# Patient Record
Sex: Female | Born: 1980 | Race: Black or African American | Hispanic: No | Marital: Single | Smoking: Current every day smoker
Health system: Southern US, Community
[De-identification: ages and names within clinical notes are randomized; demographics above are authoritative.]

## PROBLEM LIST (undated history)

## (undated) DIAGNOSIS — Z5189 Encounter for other specified aftercare: Secondary | ICD-10-CM

## (undated) DIAGNOSIS — D509 Iron deficiency anemia, unspecified: Secondary | ICD-10-CM

## (undated) DIAGNOSIS — A64 Unspecified sexually transmitted disease: Secondary | ICD-10-CM

## (undated) DIAGNOSIS — R87619 Unspecified abnormal cytological findings in specimens from cervix uteri: Secondary | ICD-10-CM

## (undated) DIAGNOSIS — K5090 Crohn's disease, unspecified, without complications: Secondary | ICD-10-CM

## (undated) DIAGNOSIS — K859 Acute pancreatitis without necrosis or infection, unspecified: Secondary | ICD-10-CM

## (undated) HISTORY — DX: Unspecified abnormal cytological findings in specimens from cervix uteri: R87.619

## (undated) HISTORY — DX: Encounter for other specified aftercare: Z51.89

## (undated) HISTORY — DX: Acute pancreatitis without necrosis or infection, unspecified: K85.90

## (undated) HISTORY — DX: Unspecified sexually transmitted disease: A64

## (undated) HISTORY — DX: Iron deficiency anemia, unspecified: D50.9

## (undated) HISTORY — PX: UMBILICAL HERNIA REPAIR: SHX196

## (undated) HISTORY — DX: Crohn's disease, unspecified, without complications: K50.90

## (undated) HISTORY — PX: OTHER SURGICAL HISTORY: SHX169

---

## 2003-01-07 ENCOUNTER — Emergency Department (HOSPITAL_COMMUNITY): Admission: EM | Admit: 2003-01-07 | Discharge: 2003-01-07 | Payer: Self-pay

## 2003-01-31 ENCOUNTER — Other Ambulatory Visit: Admission: RE | Admit: 2003-01-31 | Discharge: 2003-01-31 | Payer: Self-pay | Admitting: Obstetrics and Gynecology

## 2003-03-15 DIAGNOSIS — K5090 Crohn's disease, unspecified, without complications: Secondary | ICD-10-CM | POA: Insufficient documentation

## 2003-05-17 ENCOUNTER — Emergency Department (HOSPITAL_COMMUNITY): Admission: EM | Admit: 2003-05-17 | Discharge: 2003-05-17 | Payer: Self-pay | Admitting: Emergency Medicine

## 2003-05-17 ENCOUNTER — Encounter (INDEPENDENT_AMBULATORY_CARE_PROVIDER_SITE_OTHER): Payer: Self-pay | Admitting: *Deleted

## 2003-07-27 ENCOUNTER — Emergency Department (HOSPITAL_COMMUNITY): Admission: EM | Admit: 2003-07-27 | Discharge: 2003-07-27 | Payer: Self-pay

## 2003-07-27 ENCOUNTER — Encounter (INDEPENDENT_AMBULATORY_CARE_PROVIDER_SITE_OTHER): Payer: Self-pay | Admitting: *Deleted

## 2003-07-30 ENCOUNTER — Inpatient Hospital Stay (HOSPITAL_COMMUNITY): Admission: EM | Admit: 2003-07-30 | Discharge: 2003-08-03 | Payer: Self-pay | Admitting: Emergency Medicine

## 2003-07-30 ENCOUNTER — Encounter: Payer: Self-pay | Admitting: *Deleted

## 2003-07-30 ENCOUNTER — Encounter (INDEPENDENT_AMBULATORY_CARE_PROVIDER_SITE_OTHER): Payer: Self-pay | Admitting: *Deleted

## 2003-07-31 ENCOUNTER — Encounter (INDEPENDENT_AMBULATORY_CARE_PROVIDER_SITE_OTHER): Payer: Self-pay | Admitting: *Deleted

## 2003-08-01 ENCOUNTER — Encounter (INDEPENDENT_AMBULATORY_CARE_PROVIDER_SITE_OTHER): Payer: Self-pay | Admitting: *Deleted

## 2003-08-03 ENCOUNTER — Encounter (INDEPENDENT_AMBULATORY_CARE_PROVIDER_SITE_OTHER): Payer: Self-pay | Admitting: *Deleted

## 2003-08-04 ENCOUNTER — Encounter (INDEPENDENT_AMBULATORY_CARE_PROVIDER_SITE_OTHER): Payer: Self-pay | Admitting: *Deleted

## 2003-08-04 ENCOUNTER — Ambulatory Visit (HOSPITAL_COMMUNITY): Admission: RE | Admit: 2003-08-04 | Discharge: 2003-08-04 | Payer: Self-pay | Admitting: Gastroenterology

## 2003-08-27 ENCOUNTER — Encounter: Payer: Self-pay | Admitting: Internal Medicine

## 2006-03-14 DIAGNOSIS — Z87442 Personal history of urinary calculi: Secondary | ICD-10-CM

## 2007-03-15 DIAGNOSIS — R87619 Unspecified abnormal cytological findings in specimens from cervix uteri: Secondary | ICD-10-CM

## 2007-03-15 DIAGNOSIS — Z5189 Encounter for other specified aftercare: Secondary | ICD-10-CM

## 2007-03-15 HISTORY — DX: Encounter for other specified aftercare: Z51.89

## 2007-03-15 HISTORY — DX: Unspecified abnormal cytological findings in specimens from cervix uteri: R87.619

## 2008-03-14 HISTORY — PX: SMALL INTESTINE SURGERY: SHX150

## 2008-08-12 ENCOUNTER — Encounter: Payer: Self-pay | Admitting: Internal Medicine

## 2008-08-21 ENCOUNTER — Encounter: Payer: Self-pay | Admitting: Internal Medicine

## 2008-08-27 ENCOUNTER — Encounter: Payer: Self-pay | Admitting: Internal Medicine

## 2008-09-19 ENCOUNTER — Encounter: Payer: Self-pay | Admitting: Internal Medicine

## 2009-08-05 ENCOUNTER — Encounter (INDEPENDENT_AMBULATORY_CARE_PROVIDER_SITE_OTHER): Payer: Self-pay | Admitting: *Deleted

## 2009-08-05 ENCOUNTER — Ambulatory Visit: Payer: Self-pay | Admitting: Internal Medicine

## 2009-08-05 ENCOUNTER — Inpatient Hospital Stay (HOSPITAL_COMMUNITY): Admission: EM | Admit: 2009-08-05 | Discharge: 2009-08-07 | Payer: Self-pay | Admitting: Emergency Medicine

## 2009-08-05 ENCOUNTER — Encounter (INDEPENDENT_AMBULATORY_CARE_PROVIDER_SITE_OTHER): Payer: Self-pay | Admitting: Internal Medicine

## 2009-08-06 ENCOUNTER — Encounter: Payer: Self-pay | Admitting: Internal Medicine

## 2009-08-06 ENCOUNTER — Encounter (INDEPENDENT_AMBULATORY_CARE_PROVIDER_SITE_OTHER): Payer: Self-pay | Admitting: Internal Medicine

## 2009-08-07 ENCOUNTER — Encounter (INDEPENDENT_AMBULATORY_CARE_PROVIDER_SITE_OTHER): Payer: Self-pay | Admitting: *Deleted

## 2009-08-08 ENCOUNTER — Encounter: Payer: Self-pay | Admitting: Internal Medicine

## 2009-08-13 ENCOUNTER — Encounter: Payer: Self-pay | Admitting: Internal Medicine

## 2009-08-14 DIAGNOSIS — D638 Anemia in other chronic diseases classified elsewhere: Secondary | ICD-10-CM

## 2009-08-14 DIAGNOSIS — K859 Acute pancreatitis without necrosis or infection, unspecified: Secondary | ICD-10-CM

## 2009-08-14 DIAGNOSIS — D509 Iron deficiency anemia, unspecified: Secondary | ICD-10-CM

## 2009-08-20 ENCOUNTER — Ambulatory Visit: Payer: Self-pay | Admitting: Internal Medicine

## 2009-08-20 LAB — CONVERTED CEMR LAB
ALT: 16 units/L (ref 0–35)
AST: 12 units/L (ref 0–37)
Basophils Absolute: 0.1 10*3/uL (ref 0.0–0.1)
CO2: 27 meq/L (ref 19–32)
Eosinophils Absolute: 0.1 10*3/uL (ref 0.0–0.7)
GFR calc non Af Amer: 160.16 mL/min (ref >60)
HCT: 38.5 % (ref 36.0–46.0)
Lymphocytes Relative: 15.6 % (ref 12.0–46.0)
Lymphs Abs: 2.2 10*3/uL (ref 0.7–4.0)
MCHC: 32.7 g/dL (ref 30.0–36.0)
Monocytes Relative: 1.7 % — ABNORMAL LOW (ref 3.0–12.0)
Platelets: 523 10*3/uL — ABNORMAL HIGH (ref 150.0–400.0)
RDW: 24.7 % — ABNORMAL HIGH (ref 11.5–14.6)
Sodium: 141 meq/L (ref 135–145)
Total Bilirubin: 0.9 mg/dL (ref 0.3–1.2)
Total Protein: 6.7 g/dL (ref 6.0–8.3)

## 2009-08-25 ENCOUNTER — Ambulatory Visit: Payer: Self-pay | Admitting: Nurse Practitioner

## 2009-10-05 ENCOUNTER — Emergency Department (HOSPITAL_COMMUNITY): Admission: EM | Admit: 2009-10-05 | Discharge: 2009-10-05 | Payer: Self-pay | Admitting: Family Medicine

## 2009-12-28 ENCOUNTER — Ambulatory Visit: Payer: Self-pay | Admitting: Internal Medicine

## 2009-12-30 LAB — CONVERTED CEMR LAB
ALT: 17 units/L (ref 0–35)
AST: 20 units/L (ref 0–37)
Alkaline Phosphatase: 85 units/L (ref 39–117)
BUN: 16 mg/dL (ref 6–23)
Basophils Absolute: 0.1 10*3/uL (ref 0.0–0.1)
Basophils Relative: 0.8 % (ref 0.0–3.0)
Calcium: 8.9 mg/dL (ref 8.4–10.5)
Creatinine, Ser: 0.6 mg/dL (ref 0.4–1.2)
Eosinophils Absolute: 0.3 10*3/uL (ref 0.0–0.7)
HCT: 35 % — ABNORMAL LOW (ref 36.0–46.0)
Hemoglobin: 11.9 g/dL — ABNORMAL LOW (ref 12.0–15.0)
Lymphocytes Relative: 19.5 % (ref 12.0–46.0)
Lymphs Abs: 1.4 10*3/uL (ref 0.7–4.0)
MCHC: 34 g/dL (ref 30.0–36.0)
MCV: 86.5 fL (ref 78.0–100.0)
Monocytes Absolute: 0.3 10*3/uL (ref 0.1–1.0)
Neutro Abs: 5.1 10*3/uL (ref 1.4–7.7)
RBC: 4.05 M/uL (ref 3.87–5.11)
RDW: 18.1 % — ABNORMAL HIGH (ref 11.5–14.6)
Saturation Ratios: 4.8 % — ABNORMAL LOW (ref 20.0–50.0)
Sed Rate: 18 mm/hr (ref 0–22)
Total Bilirubin: 0.3 mg/dL (ref 0.3–1.2)
Transferrin: 250.9 mg/dL (ref 212.0–360.0)

## 2010-03-18 ENCOUNTER — Ambulatory Visit: Admit: 2010-03-18 | Payer: Self-pay | Admitting: Internal Medicine

## 2010-04-13 NOTE — Procedures (Signed)
Summary: Colonoscopy/[facility]  Colonoscopy/[facility]   Imported By: Sherian Rein 08/27/2009 08:26:56  _____________________________________________________________________  External Attachment:    Type:   Image     Comment:   External Document

## 2010-04-13 NOTE — Procedures (Signed)
Summary: Colonoscopy  Patient: Heather Foley Note: All result statuses are Final unless otherwise noted.  Tests: (1) Colonoscopy (COL)   COL Colonoscopy           DONE      [facility]     [address]     [place], [place]  [address]           COLONOSCOPY PROCEDURE REPORT           PATIENT:  Heather, Foley  MR#:  [id number]     BIRTHDATE:  [birth year], 29 yrs. old  GENDER:  female     ENDOSCOPIST:  Hedwig Morton. Juanda Chance, MD     REF. BY:     PROCEDURE DATE:  08/06/2009     PROCEDURE:  Colonoscopy 19147     ASA CLASS:  Class I     INDICATIONS:  abnl CT scan, Crohn's disease ileocolic anastomosis,           MEDICATIONS:   Versed 10 mg, Fentanyl 150 mcg           DESCRIPTION OF PROCEDURE:   After the risks benefits and     alternatives of the procedure were thoroughly explained, informed     consent was obtained.  Digital rectal exam was performed and     revealed no rectal masses.   The EC-3890Li ([id number]) endoscope was     introduced through the anus and advanced to the anastamosis,     without limitations.  The quality of the prep was good, using     MiraLax.  The instrument was then slowly withdrawn as the colon     was fully examined.     <<PROCEDUREIMAGES>>           FINDINGS:  There were mucosal changes consistent with Crohn's     disease. anastamosis ulcerated mucosa at the anastomosis and     surrounding the anastomosis With standard forceps, biopsy was     obtained and sent to pathology (see image001, image005, and     image006).  A stricture was found anastamosis 4-5 mm stricture,     unable to pass the scope  This was otherwise a normal examination     of the colon. no evidence oc active colitis in the rest of the     colon Random biopsies were obtained and sent to pathology.     Retroflexed views in the rectum revealed no abnormalities.    The     scope was then withdrawn from the patient and the procedure     completed.           COMPLICATIONS:   None     ENDOSCOPIC IMPRESSION:     1) Colitis - Crohn's in the anastamosis     2) Stricture in the anastamosis     3) Otherwise normal examination     RECOMMENDATIONS:     see chart for plan of treatment, will Start Imuran and continue     Prenisone, she may need b iologicals in the future     REPEAT EXAM:  In 5 year(s) for.           ______________________________     Hedwig Morton. Juanda Chance, MD           CC:           n.     eSIGNED:   Hedwig Morton. Brodie at 08/06/2009 02:13 PM  Heather, Foley, [id number]  Note: An exclamation mark (!) indicates a result that was not dispersed into the flowsheet. Document Creation Date: 08/06/2009 2:14 PM _______________________________________________________________________  (1) Order result status: Final Collection or observation date-time: 08/06/2009 14:08 Requested date-time:  Receipt date-time:  Reported date-time:  Referring Physician:   Ordering Physician: [name] [phone]) Specimen Source:  Source: Launa Grill Order Number: [id number] Lab site:   Appended Document: Colonoscopy Recall is in IDX for  07/2014.

## 2010-04-13 NOTE — Letter (Signed)
Summary: [facility]  [facility]   Imported By: Sherian Rein 08/27/2009 08:21:38  _____________________________________________________________________  External Attachment:    Type:   Image     Comment:   External Document

## 2010-04-13 NOTE — Assessment & Plan Note (Signed)
Summary: New - Establish Care   Vital Signs:  Patient profile:   30 year old female LMP:     08/2009 Height:      68 inches Weight:      186 pounds BMI:     28.38 BSA:     1.98 Temp:     97.5 degrees F oral Pulse rate:   75 / minute Pulse rhythm:   regular Resp:     20 per minute BP sitting:   109 / 75  (left arm) Cuff size:   regular  Vitals Entered By: Levon Hedger (August 25, 2009 10:18 AM) CC: follow-up visit [facility] hospital Is Patient Diabetic? No Pain Assessment Patient in pain? yes     Location: abdomen  Does patient need assistance? Functional Status Self care Ambulation Normal LMP (date): 08/2009     Enter LMP: 08/2009   CC:  follow-up visit [facility] hospital.  History of Present Illness:  Pt into the office to establish care. no previous PCP relocated to [facility] in January 2011 Hospitalized on 08/04/2009 (full discharge note reviewed) Presented with abdominal pain, nausea and diarrhea. S/p previous partial small bowel resection as well as right hemicoloectomy in July 2011  1.  Crohn's Flare - CT showed inflammation surrounding the second segment of the duodenum and pancreatic head reflecting exacerbation of crohn disease. Started on IV steroids and antibiotics stool negative for c.diff Imuran started by Dr. Juanda Chance for immunosuppressive, immunomudulatory effcts for active crohn disease with stricture Enteroscpy - there is no crohn disease of the stomach, duodenum and jejum and mucosa up to the ligament of Treitz was all normal Colonscopy - crohn colitis in the anastomosis ileocolic site as well as stricture in the anastomosis Pt has beent o see Dr. Juanda Chance on June 9th as ordered Pt is on prednisone taper as per GI and imuran  Last CPE 08/2007 - pt will need to schedule PAP  no acute concerns today  Habits & Providers  Alcohol-Tobacco-Diet     Alcohol drinks/day: 0     Tobacco Status: never  Exercise-Depression-Behavior     Drug Use: never  Allergies  (verified): 1)  ! Pcn  Social History: Occupation: social services ---currently unemployed Patient has never smoked.  Alcohol Use - yes-occasional Illicit Drug Use - no Single 1 child - natural birthDrug Use:  never  Review of Systems General:  Denies fever. CV:  Denies chest pain or discomfort. Resp:  Denies cough. GI:  Denies abdominal pain and diarrhea.  Physical Exam  General:  alert.   Head:  normocephalic.   Lungs:  normal breath sounds.   Heart:  normal rate and regular rhythm.   Abdomen:  soft, non-tender, and normal bowel sounds.   Msk:  normal ROM.   Neurologic:  alert & oriented X3.   Psych:  Oriented X3.     Impression & Recommendations:  Problem # 1:  CROHN'S DISEASE (ICD-555.9) stable  f/u with GI as per directed  Problem # 2:  ANEMIA, IRON DEFICIENCY (ICD-280.9) labs done at GI - stable  Complete Medication List: 1)  Azathioprine 50 Mg Tabs (Azathioprine) .... Take 1.5 tablets by mouth once daily 2)  Prednisone 20 Mg Tabs (Prednisone) .... 2 tablets by mouth once daily 3)  Prednisone 5 Mg Tabs (Prednisone) .... Take as directed  Patient Instructions: 1)  Schedule an appointment for a Complete physical exam. 2)  Schedule this after your eligibility appointment.  3)  Come fasting - do not eat after midnight  before this appointment   Colonoscopy  Procedure date:  08/06/2009  Findings:      crohn colitis in the anastomosis ileocolic site as well as stricture in the anastomosis  EGD  Procedure date:  08/06/2009  Findings:      no crohn disease of the stomach, duodenum, and jejunum, and the  mucosa up to the ligament of Treitz was all normal   Colonoscopy  Procedure date:  08/06/2009  Findings:      crohn colitis in the anastomosis ileocolic site as well as stricture in the anastomosis  EGD  Procedure date:  08/06/2009  Findings:      no crohn disease of the stomach, duodenum, and jejunum, and the  mucosa up to the ligament of  Treitz was all normal

## 2010-04-13 NOTE — Letter (Signed)
Summary: [facility]  [facility]   Imported By: Sherian Rein 08/27/2009 08:24:25  _____________________________________________________________________  External Attachment:    Type:   Image     Comment:   External Document

## 2010-04-13 NOTE — Letter (Signed)
Summary: Patient Notice- Colon Biospy Results  [facility]  [address] [place], [place] [address]   Phone: [phone]  Fax: [phone]        Aug 08, 2009 MRN: [id number]    Heather Foley [address] [place], [place]  [address]    Dear [name],  I am pleased to inform you that the biopsies taken during your recent colonoscopy did not show any evidence of cancer upon pathologic examination.The biopsies show Crohn's disease at the anastomosis but no Crohn's disease in the rest of the colon  Additional information/recommendations:  __No further action is needed at this time.  Please follow-up with      your primary care physician for your other healthcare needs.  _x_Please call [phone] to schedule a return visit to review      your condition.You already have an appointment scheduled.  _x_Continue with the treatment plan as outlined on the day of your      exam.  _x_You should have a repeat colonoscopy examination for this problem           in _5 years.  Please call us if you are having persistent problems or have questions about your condition that have not been fully answered at this time.  Sincerely,  Hart Carwin MD   This letter has been electronically signed by your physician.  Appended Document: Patient Notice- Colon Biospy Results Letter mailed to patient.

## 2010-04-13 NOTE — Letter (Signed)
Summary: PT INFORMATION SHEET  PT INFORMATION SHEET   Imported By: Arta Bruce 09/22/2009 15:30:23  _____________________________________________________________________  External Attachment:    Type:   Image     Comment:   External Document

## 2010-04-13 NOTE — Consult Note (Signed)
Summary: ABDOMINAL PAIN  NAME:  [name], [name] NO.:  [id number]   MEDICAL RECORD NO.:  [id number]                   PATIENT TYPE:  INP   LOCATION:  0478                                 FACILITY:  [facility]   PHYSICIAN:  Anselm Pancoast. Zachery Dakins, M.D.          DATE OF BIRTH:  Aug 11, 1980   DATE OF CONSULTATION:  07/30/2003  DATE OF DISCHARGE:                                   CONSULTATION   REFERRED BY:  Dr. Freida Busman   HISTORY:  Dr. Freida Busman, ER physician at [facility], asked me to see Ms.  Heather Foley, a 30 year old black female, who experienced abdominal pain  for the last two to three months. She had a CT this morning that showed a  near obstruction of the small bowel and possibly Crohn's disease. For this  reason and my being the surgeon on call, I was asked to see the patient.   It appears that she had had a previous CT back in March and at that time it  was felt to be pelvic inflammatory disease, but on retrospect, in view of  that CT it looks there was a loop of terminal ileum that is probably Crohn's  disease that was not recognized.  On the CT now revealed __________ , this  appears that there has definitely been a progression in Crohn's disease.   The patient is bloated, but not acutely tender, and I questioned whether  this could be possibly be treated medically and avoid the need for small  bowel resection at this time.   I called and had Dr. Evette Cristal see her and after his review and discussing it  with him, we were of the opinion that a trial of medical management would be  preferable to exploratory surgery for high-grade partial obstruction and  small bowel resection at this time. This patient has been on antibiotics in  treatment of a presumably pelvic inflammatory disease, but the findings on  CT are certainly more consistent with probable Crohn's than __________ PID.   The patient will be admitted to Dr. Luan Moore service and I will follow along  in case surgery would be needed if she does not respond to medical  management.                                               Anselm Pancoast. Zachery Dakins, M.D.    WJW/MEDQ  D:  07/30/2003  T:  07/30/2003  Job:  [id number]

## 2010-04-13 NOTE — Letter (Signed)
Summary: [facility]  [facility]   Imported By: Sherian Rein 08/27/2009 08:23:07  _____________________________________________________________________  External Attachment:    Type:   Image     Comment:   External Document

## 2010-04-13 NOTE — Letter (Signed)
Summary: [facility]  [facility]   Imported By: Lester Adair 09/09/2009 11:02:00  _____________________________________________________________________  External Attachment:    Type:   Image     Comment:   External Document

## 2010-04-13 NOTE — Procedures (Signed)
Summary: Upper Endoscopy  Patient: Heather Foley Note: All result statuses are Final unless otherwise noted.  Tests: (1) Upper Endoscopy (EGD)   EGD Upper Endoscopy       DONE     [facility]     [address]     [place], [place]  [address]           ENDOSCOPY PROCEDURE REPORT           PATIENT:  Heather, Foley  MR#:  [id number]     BIRTHDATE:  1981-02-16, 29 yrs. old  GENDER:  female           ENDOSCOPIST:  Hedwig Morton. Juanda Chance, MD     Referred by:           PROCEDURE DATE:  08/05/2009     PROCEDURE:  enteroscopy [id number]     ASA CLASS:  Class I     INDICATIONS:  abd. pain, abnormal CT scan, Crohn's disease, s/p TI     resection 2010; ? recurrent Crohn's in the duodenum           MEDICATIONS:   Versed 10 mg, Fentanyl 100 mcg     TOPICAL ANESTHETIC:  Cetacaine Spray           DESCRIPTION OF PROCEDURE:   After the risks benefits and     alternatives of the procedure were thoroughly explained, informed     consent was obtained.  The EC-[id number] ([id number]) endoscope was     introduced through the mouth and advanced to the proximal jejunum,     without limitations.  The instrument was slowly withdrawn as the     mucosa was fully examined.     <<PROCEDUREIMAGES>>           Mild gastritis was found. minimal antral gastritis, With standard     forceps, a biopsy was obtained and sent to pathology.  Otherwise     the examination was normal in the proximal jejunum. normal     appearing duodenum and proximal jejunum to 120cm, Random biopsies     were obtained and sent to pathology (see image002, image003,     image006, image005, image004, and image001).    Retroflexed views     revealed no abnormalities.    The scope was then withdrawn from     the patient and the procedure completed.           COMPLICATIONS:  None           ENDOSCOPIC IMPRESSION:     1) Mild gastritis     2) Otherwise normal examination in the proximal jejunum     no evidence of Crohn's disease  s/p random biopsies     RECOMMENDATIONS:     see chart for plan of treatment, colonoscopy tomorrow           REPEAT EXAM:  In 0 year(s) for.           ______________________________     Hedwig Morton. Juanda Chance, MD           CC:           n.     eSIGNED:   Hedwig Morton. Antionetta Ator at 08/05/2009 03:34 PM           Morrie Sheldon, [id number]  Note: An exclamation mark (!) indicates a result that was not dispersed into the flowsheet. Document Creation Date: 08/05/2009 3:36 PM _______________________________________________________________________  (1) Order result status: Final Collection  or observation date-time: 08/05/2009 15:29 Requested date-time:  Receipt date-time:  Reported date-time:  Referring Physician:   Ordering Physician: [name] [phone]) Specimen Source:  Source: Launa Grill Order Number: [id number] Lab site:

## 2010-04-13 NOTE — Discharge Summary (Signed)
Summary: ABDOMINAL PAIN  NAME:  Heather Foley, Heather Foley NO.:  [id number]   MEDICAL RECORD NO.:  [id number]                   PATIENT TYPE:  INP   LOCATION:  0478                                 FACILITY:  [facility]   PHYSICIAN:  Petra Kuba, M.D.                 DATE OF BIRTH:  02-22-1981   DATE OF ADMISSION:  07/30/2003  DATE OF DISCHARGE:  08/03/2003                                 DISCHARGE SUMMARY   HISTORY:  Patient was admitted with abdominal pain that had been going on  off and on for a few months.  A CAT scan showed a partial small bowel  obstruction.  Dr. Thornton Dales was consulted and felt this was compatible  with Crohn's disease, and Dr. Evette Cristal admitted the patient.  She seemed to  respond well to IV Flagyl and Solu-Medrol.  By the 20th was feeling much  better.  They changed her Solu-Medrol to Entocort.  They changed her Flagyl  to p.o. and slowly advanced her diet.  Also she was still having some  abdominal pain, she was able to advance her diet.  We changed her to p.o.  meds and by the 22nd was deemed ready for discharge.  No other workup is  done during her hospitalization.   DISCHARGE DIAGNOSIS:  Abnormal computerized tomographic scan, probably due  to Crohn's disease.   CONDITION ON DISCHARGE:  Improved.   DISCHARGE INSTRUCTIONS:  1. Flagyl 250 b.i.d. for 7 days.  2. Entocort 3 mg 3 pills a day.  3. Tylenol or Ultracet for pain.  4. She also has Vicodin at home that she could take.  5. No aspirin, Advil, Aleve, ibuprofen, or Motrin.  6. Pain management as discussed above.   ACTIVITY:  Okay to work Thursday, May 26th.   DIET:  No nuts, seeds, popcorn, fried fish or uncooked veggies until better.   WOUND CARE:  Not applicable.   SPECIAL INSTRUCTIONS:  Follow up increased pain, nausea, vomiting, fever,  diarrhea, or signs of significant bleeding.   FOLLOWUP:  Will set up an outpatient small bowel series for this week and  then follow  up with Dr. Evette Cristal in a few days after that to go over the  results and recheck symptoms.  I did discuss with her how she would probably  need an outpatient colonoscopy at some point.  Call us soon or p.r.n., as  above.   CONDITION ON DISCHARGE:  Improved.                                               Petra Kuba, M.D.    MEM/MEDQ  D:  08/03/2003  T:  08/04/2003  Job:  [id number]   cc:   Anselm Pancoast. Zachery Dakins, M.D.  [address], [address]  [place]  [place] [address]  Fax: [phone]

## 2010-04-13 NOTE — Assessment & Plan Note (Signed)
Summary: POST HOSPITAL FOLLOW-UP FOR CROHN'S          [facility]   History of Present Illness Visit Type: Follow-up Visit Primary GI MD: [name] MD Chief Complaint: follow-up hospitalization crohn's disease  Pt. denies any GI complaints  at this time. History of Present Illness:   This is a 30 year old African American female with Crohn's disease of the terminal ileum. She is status post hospitalization at [facility] with abdominal pain and an inflammatory process alongside of the duodenum originating in the area of the terminal ileum. She was discharged 3 weeks ago on Imuran 75 mg daily but she has taken only 50 mg a day. She has also been on a prednisone taper starting at 40 mg a day. She feels 100% improved. There is no rectal bleeding, abdominal pain or diarrhea. Her weight has remained stable. There has been no fever. She is status post terminal ileal resection in [place] one year ago.   GI Review of Systems    Reports acid reflux and  heartburn.      Denies abdominal pain, belching, bloating, chest pain, dysphagia with liquids, dysphagia with solids, loss of appetite, nausea, vomiting, vomiting blood, weight loss, and  weight gain.        Denies anal fissure, black tarry stools, change in bowel habit, constipation, diarrhea, diverticulosis, fecal incontinence, heme positive stool, hemorrhoids, irritable bowel syndrome, jaundice, light color stool, liver problems, rectal bleeding, and  rectal pain. Preventive Screening-Counseling & Management      Drug Use:  no.      Current Medications (verified): 1)  Azathioprine 50 Mg Tabs (Azathioprine) .Marland Kitchen.. 1 1/2 Tablets By Mouth Once Daily 2)  Prednisone 20 Mg Tabs (Prednisone) .... 2 Tablets By Mouth Once Daily  Allergies (verified): 1)  ! Pcn  Past History:  Past Medical History: Reviewed history from 08/14/2009 and no changes required. Current Problems:  ANEMIA, IRON DEFICIENCY (ICD-280.9) ANEMIA OF CHRONIC DISEASE  (ICD-285.29) PANCREATITIS, ACUTE (ICD-577.0) CROHN'S DISEASE (ICD-555.9)    Past Surgical History: Reviewed history from 08/14/2009 and no changes required. Umbilical Hernia Repair Small Bowel Resection and Right Hemicolectomy -in [place]  Family History: Reviewed history from 08/14/2009 and no changes required. No FH of Colon Cancer:  Family History of Crohn's: Paternal Cousin, Aunts Family History of Diabetes: Father  Social History: Reviewed history from 08/14/2009 and no changes required. 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  Alcohol Use - yes-occasional Illicit Drug Use - no SingleDrug Use:  no  Review of Systems       The patient complains of headaches-new.  The patient denies allergy/sinus, anemia, anxiety-new, arthritis/joint pain, back pain, blood in urine, breast changes/lumps, change in vision, confusion, cough, coughing up blood, depression-new, fainting, fatigue, fever, hearing problems, heart murmur, heart rhythm changes, itching, muscle pains/cramps, night sweats, nosebleeds, shortness of breath, skin rash, sleeping problems, sore throat, swelling of feet/legs, swollen lymph glands, thirst - excessive, urination - excessive, urination changes/pain, urine leakage, vision changes, and voice change.         Pertinent positive and negative review of systems were noted in the above HPI. All other ROS was otherwise negative.   Vital Signs:  Patient profile:   30 year old female Height:      69 inches Weight:      189 pounds BMI:     28.01 Pulse rate:   120 / minute Pulse rhythm:   regular BP sitting:   104 / 58  (left  arm)  Vitals Entered By: Milford Cage NCMA (August 20, 2009 2:47 PM)  Physical Exam  General:  alert, oriented and in no distress. Slightly overweight. Eyes:  nonicteric. Mouth:  no aphthous ulcers. Neck:  Supple; no masses or thyromegaly. Lungs:  Clear throughout to auscultation. Heart:  Regular rate and  rhythm; no murmurs, rubs,  or bruits. Abdomen:  soft relaxed abdomen with mild tenderness in epigastrium and in the right upper quadrant extending to right middle quadrant, bowel sounds are normoactive. There is no distention and no palpable mass. Extremities:  No clubbing, cyanosis, edema or deformities noted. Skin:  Intact without significant lesions or rashes. Psych:  Alert and cooperative. Normal mood and affect.   Impression & Recommendations:  Problem # 1:  CROHN'S DISEASE (ICD-555.9) Patient has Crohn's disease of the terminal ileum and is status post terminal ileal resection one year ago. She has active inflammation in the periduodenal area. An upper endoscopy did not show any evidence of active Crohn's disease of the duodenum. A colonoscopy confirmed involvement at the ileocolic anastomosis. She is doing very well and will continue the prednisone taper. She will increase her Imuran to 75 mg daily and decrease her prednisone by 5 mg every 2 weeks. She will see me in 8 weeks. We will check on her CBC and metabolic panel as well as a sedimentation rate today. I advised her to take iron supplements over the counter. Orders: TLB-CBC Platelet - w/Differential (85025-CBCD) TLB-CMP (Comprehensive Metabolic Pnl) (80053-COMP) TLB-Sedimentation Rate (ESR) (85652-ESR)  Problem # 2:  ANEMIA, IRON DEFICIENCY (ICD-280.9) We will check a CBC today. Start one a day iron supplements. Orders: TLB-CBC Platelet - w/Differential (85025-CBCD) TLB-CMP (Comprehensive Metabolic Pnl) (80053-COMP) TLB-Sedimentation Rate (ESR) (85652-ESR)  Patient Instructions: 1)  Begin iron supplements daily 2)  Please pick up iron supplements over the counter. You will need 325 mg tablets. 3)  Please take Imuran 75 mg (1.5 tablets) daily. A new prescription will be sent to your pharmacy. 4)  Please take Prednisone as directed. We have sent a new prescription for 5 mg tablets. 5)  Please have labs drawn today before  leaving.This will include a CBC, CMET, Sed rate. 6)  Office visit will be needed in 8 weeks. 7)  The medication list was reviewed and reconciled.  All changed / newly prescribed medications were explained.  A complete medication list was provided to the patient / caregiver. Prescriptions: PREDNISONE 5 MG TABS (PREDNISONE) Take as directed  #60 x 0   Entered by:   Lamona Curl CMA (AAMA)   Authorized by:   Hart Carwin MD   Signed by:   Lamona Curl CMA (AAMA) on 08/20/2009   Method used:   Electronically to        [facility] [phone]* (retail)       [address]       [place], [place]  [address]       Ph: [phone]       Fax: [phone]   RxID:   [id number] AZATHIOPRINE 50 MG TABS (AZATHIOPRINE) Take 1.5 tablets by mouth once daily  #45 x 3   Entered by:   Lamona Curl CMA (AAMA)   Authorized by:   Hart Carwin MD   Signed by:   Lamona Curl CMA (AAMA) on 08/20/2009   Method used:   Electronically to        [facility] [phone]* (retail)       [address]  [place], [place]  [address]       Ph: [phone]       Fax: [phone]   RxID:   [id number]

## 2010-04-13 NOTE — Procedures (Signed)
Summary: Upper GI Endoscopy/[facility]  Upper GI Endoscopy/[facility]   Imported By: Sherian Rein 08/27/2009 08:28:07  _____________________________________________________________________  External Attachment:    Type:   Image     Comment:   External Document

## 2010-04-13 NOTE — Assessment & Plan Note (Signed)
Summary: 8 wk f/u...as.   History of Present Illness Visit Type: Follow-up Visit Primary GI MD: [name] MD Primary Nichoel Digiulio: Lehman Prom, FNP Requesting Greenley Martone: na Chief Complaint: F/u for Crohn's. Pt denies any GI complaints  History of Present Illness:   This is a 30 year old African American female with a diagnosis of Crohn's disease of the terminal ileum made at [facility] in July 2010. She is status post right hemicolectomy for a small bowel obstruction. She was readmitted in May 2011 with exacerbation of her Crohn's disease and diarrhea. 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 She had diffuse thickening of the mid to distal ileum extending into the anastomoses. She has done well on Imuran 75 mg daily. She discontinued her prednisone taper 2 months ago. She has a history of iron deficiency anemia. Patient denies diarrhea, abdominal pain, fever or weight loss.   GI Review of Systems      Denies abdominal pain, acid reflux, belching, bloating, chest pain, dysphagia with liquids, dysphagia with solids, heartburn, loss of appetite, nausea, vomiting, vomiting blood, weight loss, and  weight gain.        Denies anal fissure, black tarry stools, change in bowel habit, constipation, diarrhea, diverticulosis, fecal incontinence, heme positive stool, hemorrhoids, irritable bowel syndrome, jaundice, light color stool, liver problems, rectal bleeding, and  rectal pain.    Current Medications (verified): 1)  Azathioprine 50 Mg Tabs (Azathioprine) .... Take 1.5 Tablets By Mouth Once Daily  Allergies (verified): 1)  ! Pcn  Past History:  Past Medical History: Reviewed history from 08/14/2009 and no changes required. Current Problems:  ANEMIA, IRON DEFICIENCY (ICD-280.9) ANEMIA OF CHRONIC DISEASE (ICD-285.29) PANCREATITIS, ACUTE (ICD-577.0) CROHN'S DISEASE (ICD-555.9)    Past Surgical  History: Reviewed history from 08/14/2009 and no changes required. Umbilical Hernia Repair Small Bowel Resection and Right Hemicolectomy -in [place]  Family History: Reviewed history from 08/14/2009 and no changes required. No FH of Colon Cancer:  Family History of Crohn's: Paternal Cousin, Aunts Family History of Diabetes: Father  Social History: Reviewed history from 08/25/2009 and no changes required. 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  Alcohol Use - yes-occasional Illicit Drug Use - no Single 1 child - natural birth  Review of Systems  The patient denies allergy/sinus, anemia, anxiety-new, arthritis/joint pain, back pain, blood in urine, breast changes/lumps, change in vision, confusion, cough, coughing up blood, depression-new, fainting, fatigue, fever, headaches-new, hearing problems, heart murmur, heart rhythm changes, itching, menstrual pain, muscle pains/cramps, night sweats, nosebleeds, pregnancy symptoms, shortness of breath, skin rash, sleeping problems, sore throat, swelling of feet/legs, swollen lymph glands, thirst - excessive , urination - excessive , urination changes/pain, urine leakage, vision changes, and voice change.         Pertinent positive and negative review of systems were noted in the above HPI. All other ROS was otherwise negative.   Vital Signs:  Patient profile:   30 year old female Height:      68 inches Weight:      202 pounds BMI:     30.83 BSA:     2.05 Pulse rate:   74 / minute Pulse rhythm:   regular BP sitting:   110 / 68  (left arm) Cuff size:   regular  Vitals Entered By: Ok Anis CMA (December 28, 2009 8:23 AM)  Physical Exam  General:  Well developed, well nourished, no acute distress. Eyes:  PERRLA, no icterus.  Mouth:  No deformity or lesions, dentition normal. Neck:  Supple; no masses or thyromegaly. Lungs:  Clear throughout to auscultation. Heart:  Regular rate and rhythm; no  murmurs, rubs,  or bruits. Abdomen:  mildly obese, soft, active bowel sounds. There is mild tenderness to the right of the midline above the umbilicus. There is no mass. Liver edge at costal margin. Right and left lower quadrants are unremarkable. Extremities:  No clubbing, cyanosis, edema or deformities noted. Skin:  Intact without significant lesions or rashes. Psych:  Alert and cooperative. Normal mood and affect.   Impression & Recommendations:  Problem # 1:  CROHN'S DISEASE (ICD-555.9) She is doing very well on Imuran 75 mg daily. Mild tenderness in the abdomen may indicate residual inflammatory changes around the duodenum but she remains asymptomatic. We will recheck her lab tests today and have her to  continue Imuran 75 mg daily. She is interested in weight loss and we will allow her to haveTennuate Dospan 75 mg daily for the next 3 months. Orders: TLB-CBC Platelet - w/Differential (85025-CBCD) TLB-CMP (Comprehensive Metabolic Pnl) (80053-COMP) TLB-IBC Pnl (Iron/FE;Transferrin) (83550-IBC) TLB-Sedimentation Rate (ESR) (85652-ESR)  Problem # 2:  ANEMIA, IRON DEFICIENCY (ICD-280.9) We will recheck a CBC, iron studies and B12 today. Orders: TLB-CBC Platelet - w/Differential (85025-CBCD) TLB-CMP (Comprehensive Metabolic Pnl) (80053-COMP) TLB-IBC Pnl (Iron/FE;Transferrin) (83550-IBC) TLB-Sedimentation Rate (ESR) (85652-ESR)  Patient Instructions: 1)  Please go to the lab (basement floor) before leaving the office today for your CBC, CMET, Sedimentation Rate, IBC Panel. 2)  Please pick up your Imuran 75 mg 1 tablet daily at the pharmacy. 3)  Begin taking Tennuate dospan 1 tablet daily. Keep a record of your weight 4)  office visit 6 months. 5)  Copy sent to : Lehman Prom, FNP 6)  The medication list was reviewed and reconciled.  All changed / newly prescribed medications were explained.  A complete medication list was provided to the patient / caregiver. Prescriptions: TENNUATE  DOSPAN 75 MG TABLET Take 1 tablet by mouth every morning  #30 x 2   Entered by:   Lamona Curl CMA (AAMA)   Authorized by:   Hart Carwin MD   Signed by:   Lamona Curl CMA (AAMA) on 12/28/2009   Method used:   Printed then faxed to ...       [facility] [address] [phone]* (retail)       [address]       [place], [place]  [address]       Ph: [phone]       Fax: [phone]   RxID:   [id number] AZASAN 75 MG TABS (AZATHIOPRINE) Take 1 tablet by mouth once a day  #30 x 11   Entered by:   Lamona Curl CMA (AAMA)   Authorized by:   Hart Carwin MD   Signed by:   Lamona Curl CMA (AAMA) on 12/28/2009   Method used:   Electronically to        [facility] [phone]* (retail)       [address]       [place], [place]  [address]       Ph: [phone]       Fax: [phone]   RxID:   [id number]

## 2010-04-13 NOTE — Letter (Signed)
Summary: New Patient letter  [facility]  [address] [place], [place] [address]   Phone: [phone]  Fax: [phone]       08/07/2009 MRN: [id number]  Heather Foley [address] [place], [place]  [address]  Dear Ms. Heather Foley,  Welcome to the Gastroenterology Division at [facility].    You are scheduled to see Dr. [name] on 08-20-09 at 2:30pm,  on the 3rd floor at [facility], [address]  We ask that you try to arrive at our office 15 minutes prior to your appointment time to allow for check-in.  We would like you to complete the enclosed self-administered evaluation form prior to your visit and bring it with you on the day of your appointment.  We will review it with you.  Also, please bring a complete list of all your medications or, if you prefer, bring the medication bottles and we will list them.  Please bring your insurance card so that we may make a copy of it.  If your insurance requires a referral to see a specialist, please bring your referral form from your primary care physician.  Co-payments are due at the time of your visit and may be paid by cash, check or credit card.     Your office visit will consist of a consult with your physician (includes a physical exam), any laboratory testing he/she may order, scheduling of any necessary diagnostic testing (e.g. x-ray, ultrasound, CT-scan), and scheduling of a procedure (e.g. Endoscopy, Colonoscopy) if required.  Please allow enough time on your schedule to allow for any/all of these possibilities.    If you cannot keep your appointment, please call [phone] to cancel or reschedule prior to your appointment date.  This allows [place] the opportunity to schedule an appointment for another patient in need of care.  If you do not cancel or reschedule by 5 p.m. the business day prior to your appointment date, you will be charged a $50.00 late cancellation/no-show fee.    Thank you for  choosing [facility] for your medical needs.  We appreciate the opportunity to care for you.  Please visit [place] at our website  to learn more about our practice.                     Sincerely,                                                             The Gastroenterology Division   Appended Document: New Patient letter Letter mailed to patient.

## 2010-04-26 ENCOUNTER — Other Ambulatory Visit: Payer: BC Managed Care – PPO

## 2010-04-26 ENCOUNTER — Encounter: Payer: Self-pay | Admitting: Internal Medicine

## 2010-04-26 ENCOUNTER — Ambulatory Visit (INDEPENDENT_AMBULATORY_CARE_PROVIDER_SITE_OTHER): Payer: BC Managed Care – PPO | Admitting: Internal Medicine

## 2010-04-26 ENCOUNTER — Encounter (INDEPENDENT_AMBULATORY_CARE_PROVIDER_SITE_OTHER): Payer: Self-pay | Admitting: Nurse Practitioner

## 2010-04-26 ENCOUNTER — Encounter (INDEPENDENT_AMBULATORY_CARE_PROVIDER_SITE_OTHER): Payer: Self-pay | Admitting: *Deleted

## 2010-04-26 ENCOUNTER — Other Ambulatory Visit: Payer: Self-pay | Admitting: Internal Medicine

## 2010-04-26 DIAGNOSIS — D509 Iron deficiency anemia, unspecified: Secondary | ICD-10-CM

## 2010-04-26 DIAGNOSIS — K5010 Crohn's disease of large intestine without complications: Secondary | ICD-10-CM

## 2010-04-26 DIAGNOSIS — K5090 Crohn's disease, unspecified, without complications: Secondary | ICD-10-CM

## 2010-04-26 LAB — COMPREHENSIVE METABOLIC PANEL
ALT: 15 U/L (ref 0–35)
AST: 16 U/L (ref 0–37)
Albumin: 3.7 g/dL (ref 3.5–5.2)
Alkaline Phosphatase: 71 U/L (ref 39–117)
BUN: 13 mg/dL (ref 6–23)
Calcium: 8.8 mg/dL (ref 8.4–10.5)
Chloride: 100 mEq/L (ref 96–112)
Creatinine, Ser: 0.6 mg/dL (ref 0.4–1.2)
Potassium: 3.7 mEq/L (ref 3.5–5.1)

## 2010-05-05 NOTE — Assessment & Plan Note (Signed)
Summary: F/U IRON DEF ANEMIA/SHERI  COPAY/NOSHOW   Vital Signs:  Patient profile:   30 year old female Height:      68 inches Weight:      193 pounds BMI:     29.45 Pulse rate:   80 / minute Pulse rhythm:   regular BP sitting:   118 / 64  (right arm) Cuff size:   regular  Vitals Entered By: Christie Nottingham CMA Duncan Dull) (April 26, 2010 4:14 PM)  History of Present Illness Visit Type: Follow-up Visit Primary GI MD: [name] MD Primary Provider: Lehman Prom, FNP Requesting Provider: na Chief Complaint: f/u iron def anemia. Pt states since being on her iron supplement, she has noticed a change in her energy level which has been much better. History of Present Illness:   This is a 30 year old African American female with Crohn's disease of the terminal ileum diagnosed at [facility] in July 2010. She is status post right hemicolectomy for small bowel obstruction. She was admitted in May 2011 with an exacerbation of Crohn's disease and a CT scan showing duodenal inflammation in the second portion of the duodenum with questionable distal thickening of the mid and distal ileum. She has done extremely well since then except for iron deficiency. She has been maintained on Imuran 75 mg daily. She takes prenatal vitamins with iron. She denies any diarrhea, abdominal pain, fever or nausea or vomiting. She denies symptoms of gastroesophageal reflux.   GI Review of Systems      Denies abdominal pain, acid reflux, belching, bloating, chest pain, dysphagia with liquids, dysphagia with solids, heartburn, loss of appetite, nausea, vomiting, vomiting blood, weight loss, and  weight gain.        Denies anal fissure, black tarry stools, change in bowel habit, constipation, diarrhea, diverticulosis, fecal incontinence, heme positive stool, hemorrhoids, irritable bowel syndrome, jaundice, light color stool, liver problems, rectal bleeding, and  rectal pain.  Current Medications  (verified): 1)  Azasan 75 Mg Tabs (Azathioprine) .... Take 1 Tablet By Mouth Once A Day 2)  Tennuate Dospan 75 Mg Tablet .... Take 1 Tablet By Mouth Every Morning 3)  Multivitamins   Tabs (Multiple Vitamin) .... One Tablet By Mouth Once Daily  Allergies (verified): 1)  ! Pcn  Past History:  Past Medical History: Reviewed history from 08/14/2009 and no changes required. Current Problems:  ANEMIA, IRON DEFICIENCY (ICD-280.9) ANEMIA OF CHRONIC DISEASE (ICD-285.29) PANCREATITIS, ACUTE (ICD-577.0) CROHN'S DISEASE (ICD-555.9)    Past Surgical History: Reviewed history from 08/14/2009 and no changes required. Umbilical Hernia Repair Small Bowel Resection and Right Hemicolectomy -in [place]  Family History: Reviewed history from 08/14/2009 and no changes required. No FH of Colon Cancer:  Family History of Crohn's: Paternal Cousin, Aunts Family History of Diabetes: Father  Social History: Reviewed history from 08/25/2009 and no changes required. 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  Alcohol Use - yes-occasional Illicit Drug Use - no Single 1 child - natural birth  Review of Systems       The patient complains of anemia.  The patient denies allergy/sinus, anxiety-new, arthritis/joint pain, back pain, blood in urine, breast changes/lumps, change in vision, confusion, cough, coughing up blood, depression-new, fainting, fatigue, fever, headaches-new, hearing problems, heart murmur, heart rhythm changes, itching, menstrual pain, muscle pains/cramps, night sweats, nosebleeds, pregnancy symptoms, shortness of breath, skin rash, sleeping problems, sore throat, swelling of feet/legs, swollen lymph glands, thirst - excessive , urination - excessive , urination changes/pain, urine  leakage, vision changes, and voice change.         Pertinent positive and negative review of systems were noted in the above HPI. All other ROS was otherwise negative.    Physical Exam  General:  Well developed, well nourished, no acute distress. Eyes:  PERRLA, no icterus. Mouth:  No deformity or lesions, dentition normal. Neck:  Supple; no masses or thyromegaly. Lungs:  Clear throughout to auscultation. Heart:  Regular rate and rhythm; no murmurs, rubs,  or bruits. Abdomen:  soft, nontender abdomen with stretch marks. Normal active bowel sounds. No tenderness. No distention. Minimal discomfort in the midepigastrium. Extremities:  No clubbing, cyanosis, edema or deformities noted. Skin:  Intact without significant lesions or rashes. Psych:  Alert and cooperative. Normal mood and affect.   Impression & Recommendations:  Problem # 1:  ANEMIA, IRON DEFICIENCY (ICD-280.9) We will recheck her CBC, B12 and iron studies today. Orders: TLB-CMP (Comprehensive Metabolic Pnl) (80053-COMP) TLB-B12, Serum-Total ONLY ([address]-U04) TLB-Ferritin (82728-FER) TLB-Folic Acid (Folate) (82746-FOL) TLB-IBC Pnl (Iron/FE;Transferrin) (83550-IBC)  Problem # 2:  CROHN'S DISEASE (ICD-555.9) Patient is status post terminal ileal resection in July 2010. She is currently doing well. Patient is to continue Imuran 75 mg daily. Orders: TLB-CMP (Comprehensive Metabolic Pnl) (80053-COMP) TLB-B12, Serum-Total ONLY (54098-J19) TLB-Ferritin (82728-FER) TLB-Folic Acid (Folate) (82746-FOL) TLB-IBC Pnl (Iron/FE;Transferrin) (83550-IBC)  Patient Instructions: 1)  Your physician requests that you go to the basement floor of our office to have the following labwork completed before leaving today: CMET, Anemia Profile 2)  return in 6 months 3)  Please pick up your prescriptions at the pharmacy. Electronic prescription(s) has already been sent for Tennuate Dospan. 4)  Please schedule a follow-up appointment in 6 months. 5)  The medication list was reviewed and reconciled.  All changed / newly prescribed medications were explained.  A complete medication list was provided to the patient /  caregiver. Prescriptions: TENNUATE DOSPAN 75 MG TABLET Take 1 tablet by mouth every morning  #30 x 5   Entered by:   Lamona Curl CMA (AAMA)   Authorized by:   Hart Carwin MD   Signed by:   Lamona Curl CMA (AAMA) on 04/26/2010   Method used:   Faxed to ...       [facility] [address] [phone]* (retail)       [address]       [place], [place]  [address]       Ph: [phone]       Fax: [phone]   RxID:   [id number]    Orders Added: 1)  TLB-CMP (Comprehensive Metabolic Pnl) [80053-COMP] 2)  TLB-B12, Serum-Total ONLY [82607-B12] 3)  TLB-Ferritin [82728-FER] 4)  TLB-Folic Acid (Folate) [82746-FOL] 5)  TLB-IBC Pnl (Iron/FE;Transferrin) [83550-IBC]

## 2010-05-05 NOTE — Letter (Signed)
Summary: [facility]  [facility]   Imported By: Arta Bruce 04/28/2010 14:03:48  _____________________________________________________________________  External Attachment:    Type:   Image     Comment:   External Document

## 2010-05-31 LAB — STOOL CULTURE

## 2010-05-31 LAB — CBC
HCT: 27.1 % — ABNORMAL LOW (ref 36.0–46.0)
HCT: 30 % — ABNORMAL LOW (ref 36.0–46.0)
HCT: 33.7 % — ABNORMAL LOW (ref 36.0–46.0)
Hemoglobin: 10.9 g/dL — ABNORMAL LOW (ref 12.0–15.0)
MCHC: 32.4 g/dL (ref 30.0–36.0)
MCHC: 32.5 g/dL (ref 30.0–36.0)
MCV: 75.5 fL — ABNORMAL LOW (ref 78.0–100.0)
Platelets: 414 10*3/uL — ABNORMAL HIGH (ref 150–400)
Platelets: 462 10*3/uL — ABNORMAL HIGH (ref 150–400)
RBC: 4.41 MIL/uL (ref 3.87–5.11)
RDW: 17.6 % — ABNORMAL HIGH (ref 11.5–15.5)
RDW: 17.7 % — ABNORMAL HIGH (ref 11.5–15.5)
WBC: 10 10*3/uL (ref 4.0–10.5)
WBC: 8.6 10*3/uL (ref 4.0–10.5)

## 2010-05-31 LAB — COMPREHENSIVE METABOLIC PANEL
ALT: 12 U/L (ref 0–35)
Alkaline Phosphatase: 82 U/L (ref 39–117)
BUN: 5 mg/dL — ABNORMAL LOW (ref 6–23)
CO2: 18 mEq/L — ABNORMAL LOW (ref 19–32)
GFR calc non Af Amer: >60 mL/min (ref >60)
Glucose, Bld: 69 mg/dL — ABNORMAL LOW (ref 70–99)
Potassium: 3.2 mEq/L — ABNORMAL LOW (ref 3.5–5.1)
Sodium: 135 mEq/L (ref 135–145)
Total Bilirubin: 0.7 mg/dL (ref 0.3–1.2)
Total Protein: 7.4 g/dL (ref 6.0–8.3)

## 2010-05-31 LAB — URINALYSIS, ROUTINE W REFLEX MICROSCOPIC
Ketones, ur: >80 mg/dL — AB
Leukocytes, UA: NEGATIVE
Nitrite: NEGATIVE
Specific Gravity, Urine: 1.035 — ABNORMAL HIGH (ref 1.005–1.030)
pH: 6 (ref 5.0–8.0)

## 2010-05-31 LAB — HEMOCCULT GUIAC POC 1CARD (OFFICE): Fecal Occult Bld: NEGATIVE

## 2010-05-31 LAB — DIFFERENTIAL
Basophils Absolute: 0.2 10*3/uL — ABNORMAL HIGH (ref 0.0–0.1)
Basophils Relative: 1 % (ref 0–1)
Eosinophils Absolute: 0 10*3/uL (ref 0.0–0.7)
Monocytes Relative: 7 % (ref 3–12)
Neutro Abs: 9.6 10*3/uL — ABNORMAL HIGH (ref 1.7–7.7)
Neutrophils Relative %: 79 % — ABNORMAL HIGH (ref 43–77)

## 2010-05-31 LAB — URINE MICROSCOPIC-ADD ON

## 2010-05-31 LAB — POCT PREGNANCY, URINE: Preg Test, Ur: NEGATIVE

## 2010-05-31 LAB — FECAL LACTOFERRIN, QUANT

## 2010-05-31 LAB — LIPASE, BLOOD: Lipase: 20 U/L (ref 11–59)

## 2010-05-31 LAB — URINE CULTURE

## 2010-05-31 LAB — AMYLASE
Amylase: 33 U/L (ref 0–105)
Amylase: 41 U/L (ref 0–105)

## 2010-07-30 NOTE — H&P (Signed)
NAME:  Heather Foley, Heather Foley NO.:  [id number]   MEDICAL RECORD NO.:  [id number]                   PATIENT TYPE:  INP   LOCATION:  0478                                 FACILITY:  [facility]   PHYSICIAN:  Graylin Shiver, M.D.                DATE OF BIRTH:  Mar 04, 1981   DATE OF ADMISSION:  07/30/2003  DATE OF DISCHARGE:                                HISTORY & PHYSICAL   CHIEF COMPLAINT:  Abdominal pain.   HISTORY OF PRESENT ILLNESS:  This patient is a 30 year old black female who  has been experiencing abdominal pain since March of this year.  She went to  the emergency room in March and had a CT scan done.  She was told that  nothing specifically was found and she probably had pelvic inflammatory  disease and to followup with the gynecologist.  She did followup with a  gynecologist and was treated with Flagyl and doxycycline, but told by the  gynecologist that they weren't sure that she had pelvic inflammatory  disease.  After she discontinued the Flagyl and doxycycline, which seemed to  help somewhat, she started to get the abdominal pain again and states that  last week the abdominal pain came back and was quite intense.  She has  continued to have abdominal pain in the lower and mid abdomen and presented  to the emergency room today.  A CT scan of the abdomen and pelvis was done  today in the emergency room showing findings consistent with Crohn's disease  of the terminal ileum and evidence of a small bowel obstruction.  There was  no evidence of abscess or fistula.  The patient was seen initially by Dr.  Zachery Dakins and GI was asked to see her for further evaluation and treatment.  After reviewing the findings, the patient is being admitted to the GI  service for further evaluation and treatment.   PAST HISTORY/MEDICAL PROBLEMS:  No chronic medical problems.   SURGERY:  Umbilical hernia surgery.   ALLERGIES:  PENICILLIN.   MEDICATIONS PRIOR TO ADMISSION:   The patient was given pain medication in  the emergency room, I think it is oxycodone.   SOCIAL HISTORY:  He does not smoke, or drink alcohol.   FAMILY HISTORY:  Noncontributory.   SOCIAL HISTORY:  No complaints of fever or chills.  No chest pain, shortness  of breath, cough, sputum production.   GASTROINTESTINAL:  As above.  Plus, she has been having some nausea and  vomiting, also some diarrhea recently.   PHYSICAL:  VITAL SIGNS:  Are stable.  She does not appear in any acute  distress although she does state that her abdomen hurts.  HEENT:  Negative.  NECK:  Supple.  No masses, adenopathy or goiter.  HEART:  Regular rhythm.  No murmurs, gallops or rubs.  LUNGS:  Clear.  ABDOMEN:  Bowel sounds are present.  The  abdomen is soft, it is not  particularly distended.  There is some mild diffuse discomfort to palpation  but no rebound, guarding or hepatosplenomegaly.  There is no sign of  surgical abdomen at this time.  EXTREMITIES:  No edema.   LABORATORIES:  Are pending.   IMPRESSION:  1. Small bowel obstruction.  2. Findings on CT compatible with Crohn's disease of terminal ileum.   PLAN:  1. Patient will be admitted to the hospital.  2. NG tube with NG suction will be instituted.  3. She will be given IV hydration.  4. She will be started on Solu-Medrol 60 mg IV q.i.d., Flagyl 500 mg IV q.6     hours.  5. A repeat abdominal x-ray will be obtained tomorrow.  6. Surgery will follow the patient just in case the bowel obstruction does     not relieve itself.                                               Graylin Shiver, M.D.    Germain Osgood  D:  07/30/2003  T:  07/30/2003  Job:  [id number]   cc:   Anselm Pancoast. Zachery Dakins, M.D.  [address], [address]  [place]  [place] [address]  Fax: [phone]   Fleet Contras, M.D.  [address]  [place]  [place] [address]  Fax: [phone]

## 2010-07-30 NOTE — Discharge Summary (Signed)
NAME:  Heather Foley, Heather Foley NO.:  [id number]   MEDICAL RECORD NO.:  [id number]                   PATIENT TYPE:  INP   LOCATION:  0478                                 FACILITY:  [facility]   PHYSICIAN:  Petra Kuba, M.D.                 DATE OF BIRTH:  Sep 24, 1980   DATE OF ADMISSION:  07/30/2003  DATE OF DISCHARGE:  08/03/2003                                 DISCHARGE SUMMARY   HISTORY:  Patient was admitted with abdominal pain that had been going on  off and on for a few months.  A CAT scan showed a partial small bowel  obstruction.  Dr. Thornton Dales was consulted and felt this was compatible  with Crohn's disease, and Dr. Evette Cristal admitted the patient.  She seemed to  respond well to IV Flagyl and Solu-Medrol.  By the 20th was feeling much  better.  They changed her Solu-Medrol to Entocort.  They changed her Flagyl  to p.o. and slowly advanced her diet.  Also she was still having some  abdominal pain, she was able to advance her diet.  We changed her to p.o.  meds and by the 22nd was deemed ready for discharge.  No other workup is  done during her hospitalization.   DISCHARGE DIAGNOSIS:  Abnormal computerized tomographic scan, probably due  to Crohn's disease.   CONDITION ON DISCHARGE:  Improved.   DISCHARGE INSTRUCTIONS:  1. Flagyl 250 b.i.d. for 7 days.  2. Entocort 3 mg 3 pills a day.  3. Tylenol or Ultracet for pain.  4. She also has Vicodin at home that she could take.  5. No aspirin, Advil, Aleve, ibuprofen, or Motrin.  6. Pain management as discussed above.   ACTIVITY:  Okay to work Thursday, May 26th.   DIET:  No nuts, seeds, popcorn, fried fish or uncooked veggies until better.   WOUND CARE:  Not applicable.   SPECIAL INSTRUCTIONS:  Follow up increased pain, nausea, vomiting, fever,  diarrhea, or signs of significant bleeding.   FOLLOWUP:  Will set up an outpatient small bowel series for this week and  then follow up with Dr. Evette Cristal in a few  days after that to go over the  results and recheck symptoms.  I did discuss with her how she would probably  need an outpatient colonoscopy at some point.  Call us soon or p.r.n., as  above.   CONDITION ON DISCHARGE:  Improved.                                               Petra Kuba, M.D.    MEM/MEDQ  D:  08/03/2003  T:  08/04/2003  Job:  [id number]   cc:   Anselm Pancoast. Zachery Dakins, M.D.  [address]  [address], [address]  [place]  [place] [address]  Fax: [phone]

## 2010-07-30 NOTE — Consult Note (Signed)
NAME:  ASMI, FUGERE NO.:  [id number]   MEDICAL RECORD NO.:  [id number]                   PATIENT TYPE:  INP   LOCATION:  0478                                 FACILITY:  [facility]   PHYSICIAN:  Anselm Pancoast. Zachery Dakins, M.D.          DATE OF BIRTH:  1980-10-31   DATE OF CONSULTATION:  07/30/2003  DATE OF DISCHARGE:                                   CONSULTATION   REFERRED BY:  Dr. Freida Busman   HISTORY:  Dr. Freida Busman, ER physician at [facility], asked me to see Ms.  Heather Foley, a 30 year old black female, who experienced abdominal pain  for the last two to three months. She had a CT this morning that showed a  near obstruction of the small bowel and possibly Crohn's disease. For this  reason and my being the surgeon on call, I was asked to see the patient.   It appears that she had had a previous CT back in March and at that time it  was felt to be pelvic inflammatory disease, but on retrospect, in view of  that CT it looks there was a loop of terminal ileum that is probably Crohn's  disease that was not recognized.  On the CT now revealed __________ , this  appears that there has definitely been a progression in Crohn's disease.   The patient is bloated, but not acutely tender, and I questioned whether  this could be possibly be treated medically and avoid the need for small  bowel resection at this time.   I called and had Dr. Evette Cristal see her and after his review and discussing it  with him, we were of the opinion that a trial of medical management would be  preferable to exploratory surgery for high-grade partial obstruction and  small bowel resection at this time. This patient has been on antibiotics in  treatment of a presumably pelvic inflammatory disease, but the findings on  CT are certainly more consistent with probable Crohn's than __________ PID.   The patient will be admitted to Dr. Luan Moore service and I will follow along  in case surgery would be  needed if she does not respond to medical  management.                                               Anselm Pancoast. Zachery Dakins, M.D.    WJW/MEDQ  D:  07/30/2003  T:  07/30/2003  Job:  [id number]

## 2010-08-25 ENCOUNTER — Other Ambulatory Visit: Payer: Self-pay | Admitting: Internal Medicine

## 2011-01-14 ENCOUNTER — Other Ambulatory Visit: Payer: Self-pay | Admitting: Internal Medicine

## 2011-01-24 ENCOUNTER — Other Ambulatory Visit: Payer: Self-pay | Admitting: Internal Medicine

## 2011-01-24 NOTE — Telephone Encounter (Signed)
Dr Juanda Chance- Patient's last office visit was 04/26/10. She was to follow up 6 months after that for her crohns. She last got a refill of her azathioprine on 08/25/10 with 1 additional refill. Do you want me to fill patient's rx since she wont be coming in until January?

## 2011-01-24 NOTE — Telephone Encounter (Signed)
Yes, please refill all meds till I see her. Also she will need CBC and C-met as soon as she gets insurance.

## 2011-01-25 ENCOUNTER — Telehealth: Payer: Self-pay | Admitting: Internal Medicine

## 2011-01-25 MED ORDER — AZATHIOPRINE 50 MG PO TABS: ORAL_TABLET | ORAL | Status: DC

## 2011-01-25 NOTE — Telephone Encounter (Signed)
rx sent to pharmacy

## 2011-01-25 NOTE — Telephone Encounter (Signed)
rx was already sent this morning. I have left a message for the patient stating this.

## 2011-06-02 ENCOUNTER — Other Ambulatory Visit: Payer: Self-pay | Admitting: Internal Medicine

## 2011-07-22 ENCOUNTER — Encounter: Payer: Self-pay | Admitting: *Deleted

## 2011-08-10 ENCOUNTER — Other Ambulatory Visit (INDEPENDENT_AMBULATORY_CARE_PROVIDER_SITE_OTHER): Payer: Managed Care, Other (non HMO)

## 2011-08-10 ENCOUNTER — Encounter: Payer: Self-pay | Admitting: Internal Medicine

## 2011-08-10 ENCOUNTER — Ambulatory Visit (INDEPENDENT_AMBULATORY_CARE_PROVIDER_SITE_OTHER): Payer: Managed Care, Other (non HMO) | Admitting: Internal Medicine

## 2011-08-10 VITALS — BP 118/64 | HR 88 | Ht 67.5 in | Wt 191.0 lb

## 2011-08-10 DIAGNOSIS — K5090 Crohn's disease, unspecified, without complications: Secondary | ICD-10-CM

## 2011-08-10 LAB — COMPREHENSIVE METABOLIC PANEL
ALT: 15 U/L (ref 0–35)
AST: 18 U/L (ref 0–37)
Albumin: 3.3 g/dL — ABNORMAL LOW (ref 3.5–5.2)
CO2: 24 mEq/L (ref 19–32)
Calcium: 8.9 mg/dL (ref 8.4–10.5)
Chloride: 108 mEq/L (ref 96–112)
GFR: 157.9 mL/min (ref >60.00)
Potassium: 3.9 mEq/L (ref 3.5–5.1)

## 2011-08-10 LAB — CBC WITH DIFFERENTIAL/PLATELET
Basophils Absolute: 0.1 10*3/uL (ref 0.0–0.1)
Eosinophils Relative: 1.4 % (ref 0.0–5.0)
HCT: 41.8 % (ref 36.0–46.0)
Hemoglobin: 13.7 g/dL (ref 12.0–15.0)
Lymphocytes Relative: 12.8 % (ref 12.0–46.0)
Monocytes Relative: 1.8 % — ABNORMAL LOW (ref 3.0–12.0)
Neutro Abs: 10.1 10*3/uL — ABNORMAL HIGH (ref 1.4–7.7)
Platelets: 359 10*3/uL (ref 150.0–400.0)
RDW: 14.1 % (ref 11.5–14.6)
WBC: 12.1 10*3/uL — ABNORMAL HIGH (ref 4.5–10.5)

## 2011-08-10 LAB — VITAMIN B12: Vitamin B-12: 492 pg/mL (ref 211–911)

## 2011-08-10 MED ORDER — AZATHIOPRINE 50 MG PO TABS: ORAL_TABLET | ORAL | Status: DC

## 2011-08-10 NOTE — Progress Notes (Signed)
Heather Foley 1980/12/12 MRN [id number]   History of Present Illness:  This is a 31 year old African American female with Crohn's disease of the terminal ileum. She is status post terminal ileal resection and ileocolic anastomosis in July 2010 at [facility] in [place]. She was hospitalized for active colitis in May 2011 when her last colonoscopy showed a mild stricture at the ileocolic anastomosis. Biopsies showed chronic active colitis at the anastomosis. Random biopsies of the colon were normal. During her hospitalization, a CT scan of the abdomen showed an inflammatory process in the duodenum and cecum as well as the distal terminal ileum. She has been on Imuran 50 mg daily. She works for an [employer]. She is doing very well. There has been no diarrhea. She has occasional right lower quadrant abdominal pain if she overeats. She takes over-the-counter multivitamins and iron supplements. She is a single parent of a 104-year-old, boy.   Past Medical History  Diagnosis Date  . Iron deficiency anemia   . Acute pancreatitis   . Crohn disease    Past Surgical History  Procedure Date  . Umbilical hernia repair   . Small intestine surgery     with right hemicolectomy-in [place]    reports that she has never smoked. She has never used smokeless tobacco. She reports that she drinks alcohol. She reports that she does not use illicit drugs. family history includes Crohn's disease in her cousin and Diabetes in her father.  There is no history of Colon cancer. Allergies  Allergen Reactions  . Penicillins         Review of Systems: Negative for mouth ulcers low back pain fever or rectal bleeding  The remainder of the 10 point ROS is negative except as outlined in H&P   Physical Exam: General appearance  Well developed, in no distress. Eyes- non icteric. HEENT nontraumatic, normocephalic. Mouth no lesions, tongue papillated, no cheilosis. Neck supple without adenopathy, thyroid  not enlarged, no carotid bruits, no JVD. Lungs Clear to auscultation bilaterally. Cor normal S1, normal S2, regular rhythm, no murmur,  quiet precordium. Abdomen: Soft, nontender abdomen with normoactive bowel sounds. No distention. Liver edge at costal margin. Well-healed surgical scar. Rectal: Not done. Extremities no pedal edema. Skin no lesions. Neurological alert and oriented x 3. Psychological normal mood and affect.  Assessment and Plan:  Problem #1 Crohn's disease of the terminal ileum. Patient is status post ileocolic anastomosis in 2010. She is doing well on low-dose Imuran 50 mg daily. We will check her labs today including B12 and iron levels. We will refill her Imuran. No diagnostic studies need to be completed at this time. If pain recurs, she will need a CT scan of the abdomen. She needs her blood tests monitored at least every 6 months and I will see her in one year unless she develops specific problems.   08/10/2011 [name]

## 2011-08-10 NOTE — Patient Instructions (Addendum)
Your physician has requested that you go to the basement for the following lab work before leaving today: CBC, B12, CMET, IBC, Sedimentation rate, TSH We have sent the following medications to your pharmacy for you to pick up at your convenience: Imuran Follow up with Dr Juanda Chance in 1 year.

## 2011-09-12 ENCOUNTER — Telehealth: Payer: Self-pay | Admitting: Internal Medicine

## 2011-09-12 NOTE — Telephone Encounter (Signed)
Prilosec 20 mg, #30, 1 po qd, 3 refills

## 2011-09-12 NOTE — Telephone Encounter (Signed)
Line busy will try again. 

## 2011-09-12 NOTE — Telephone Encounter (Signed)
Spoke with patient and she is having pain, tenderness and burning under sternum area usually after eating. States it started on Friday. She also reports diarrhea 2/day. She is taking her Imuran. She is asking for something for the burning, pain area under sternum. She states she does not have the money for copay for OV and would like an rx if possible. HX, Crohn's.  please, advise.

## 2011-09-13 MED ORDER — OMEPRAZOLE 20 MG PO CPDR: 20.0000 mg | DELAYED_RELEASE_CAPSULE | Freq: Every day | ORAL | Status: DC

## 2011-09-13 NOTE — Telephone Encounter (Signed)
Rx sent and patient aware 

## 2011-09-14 ENCOUNTER — Emergency Department (HOSPITAL_COMMUNITY)
Admission: EM | Admit: 2011-09-14 | Discharge: 2011-09-14 | Disposition: A | Discharge status: Home / Self Care | Payer: Managed Care, Other (non HMO) | Attending: Emergency Medicine | Admitting: Emergency Medicine

## 2011-09-14 ENCOUNTER — Encounter (HOSPITAL_COMMUNITY): Payer: Self-pay | Admitting: Emergency Medicine

## 2011-09-14 DIAGNOSIS — L02419 Cutaneous abscess of limb, unspecified: Secondary | ICD-10-CM | POA: Insufficient documentation

## 2011-09-14 DIAGNOSIS — K5090 Crohn's disease, unspecified, without complications: Secondary | ICD-10-CM | POA: Insufficient documentation

## 2011-09-14 DIAGNOSIS — L03115 Cellulitis of right lower limb: Secondary | ICD-10-CM

## 2011-09-14 MED ORDER — SULFAMETHOXAZOLE-TRIMETHOPRIM 800-160 MG PO TABS: 1.0000 | ORAL_TABLET | Freq: Two times a day (BID) | ORAL | Status: AC

## 2011-09-14 MED ORDER — KETOROLAC TROMETHAMINE 30 MG/ML IJ SOLN
30.0000 mg | Freq: Once | INTRAMUSCULAR | Status: AC
  Administered 2011-09-14: 30 mg via INTRAVENOUS
  Filled 2011-09-14: qty 1

## 2011-09-14 MED ORDER — VANCOMYCIN HCL IN DEXTROSE 1-5 GM/200ML-% IV SOLN
1000.0000 mg | Freq: Once | INTRAVENOUS | Status: AC
  Administered 2011-09-14: 1000 mg via INTRAVENOUS
  Filled 2011-09-14: qty 200

## 2011-09-14 MED ORDER — NAPROXEN 500 MG PO TABS: 500.0000 mg | ORAL_TABLET | Freq: Two times a day (BID) | ORAL | Status: DC

## 2011-09-14 MED ORDER — SULFAMETHOXAZOLE-TMP DS 800-160 MG PO TABS: 1.0000 | ORAL_TABLET | Freq: Once | ORAL | Status: DC

## 2011-09-14 NOTE — ED Notes (Signed)
Pt c/o of red skin and infected area to right lower leg x1 week. Pt denies fever but reports pain 7/10. Pt able to ambulate without assistance.

## 2011-09-14 NOTE — ED Provider Notes (Signed)
History     CSN: [id number]  Arrival date & time 09/14/11  1633   First MD Initiated Contact with Patient 09/14/11 1747      No chief complaint on file.   (Consider location/radiation/quality/duration/timing/severity/associated sxs/prior treatment) HPI Comments: 31 year old female with a history of approximately one week of gradual onset of redness and swelling to the right lower extremity. She states that she hit her anterior right lower extremity below the knee against a bed frame causing a bruise which over the last 7 days has developed into redness warmth which is spreading. She does admit to associated swelling of the foot which is mild. She denies fevers chills nausea vomiting palpitations weakness or lightheadedness. Symptoms are persistent, worse with palpation. She does have a history of being on prednisone for Crohn's disease but this has not been on prednisone for a year and half. She currently takes azathioprine for Crohn's treatment.  The history is provided by the patient.    Past Medical History  Diagnosis Date  . Iron deficiency anemia   . Acute pancreatitis   . Crohn disease     Past Surgical History  Procedure Date  . Umbilical hernia repair   . Small intestine surgery     with right hemicolectomy-in [place]    Family History  Problem Relation Age of Onset  . Colon cancer Neg Hx   . Crohn's disease Cousin   . Diabetes Father     History  Substance Use Topics  . Smoking status: Never Smoker   . Smokeless tobacco: Never Used  . Alcohol Use: Yes     occasional    OB History    Grav Para Term Preterm Abortions TAB SAB Ect Mult Living                  Review of Systems  All other systems reviewed and are negative.    Allergies  Penicillins  Home Medications   Current Outpatient Rx  Name Route Sig Dispense Refill  . BC FAST PAIN RELIEF PO Oral Take 1 Package by mouth daily as needed. headache    . AZATHIOPRINE 50 MG PO TABS  50 mg.    .  FERROUS FUMARATE 325 (106 FE) MG PO TABS Oral Take 1 tablet by mouth daily.    . IBUPROFEN 200 MG PO TABS Oral Take 800 mg by mouth every 6 (six) hours as needed.    . MULTI-VITAMIN/MINERALS PO TABS Oral Take 1 tablet by mouth daily.    Marland Kitchen NAPROXEN 500 MG PO TABS Oral Take 1 tablet (500 mg total) by mouth 2 (two) times daily with a meal. 30 tablet 0  . SULFAMETHOXAZOLE-TRIMETHOPRIM 800-160 MG PO TABS Oral Take 1 tablet by mouth every 12 (twelve) hours. 20 tablet 0    BP 104/68  Pulse 78  Temp 98.5 F (36.9 C) (Oral)  Resp 18  Ht 5\' 8"  (1.727 m)  Wt 189 lb (85.73 kg)  BMI 28.74 kg/m2  SpO2 100%  LMP 09/09/2011  Physical Exam  Nursing note and vitals reviewed. Constitutional: She appears well-developed and well-nourished. No distress.  HENT:  Head: Normocephalic and atraumatic.  Mouth/Throat: Oropharynx is clear and moist. No oropharyngeal exudate.  Eyes: Conjunctivae and EOM are normal. Pupils are equal, round, and reactive to light. Right eye exhibits no discharge. Left eye exhibits no discharge. No scleral icterus.  Neck: Normal range of motion. Neck supple. No JVD present. No thyromegaly present.  Cardiovascular: Normal rate, regular rhythm, normal heart  sounds and intact distal pulses.  Exam reveals no gallop and no friction rub.   No murmur heard. Pulmonary/Chest: Effort normal and breath sounds normal. No respiratory distress. She has no wheezes. She has no rales.  Abdominal: Soft. Bowel sounds are normal. She exhibits no distension and no mass. There is no tenderness.  Musculoskeletal: Normal range of motion. She exhibits tenderness ( Mild tenderness over the cellulitis of the right lower extremity). She exhibits no edema.  Lymphadenopathy:    She has no cervical adenopathy.  Neurological: She is alert. Coordination normal.  Skin: Skin is warm and dry. Rash noted. There is erythema.       Erythema and redness and warmth to the anterior right lower extremity extending from the  mid shin through the proximal ankle, this is not circumferential, there are normal pulses and sensation of the foot, there is minimal edema of the foot. There is a central superficial wound which is not open, not draining and not indurated in the middle of the erythematous area.  Psychiatric: She has a normal mood and affect. Her behavior is normal.    ED Course  Procedures (including critical care time)  Labs Reviewed - No data to display No results found.   1. Cellulitis of right lower extremity       MDM  Exam consistent with a cellulitis, doubt deep venous thrombosis or other source of swelling and redness, no fever, no tachycardia or hypotension. Intravenous vancomycin started due to the patient's medications and having relative immunosuppression. Otherwise she appears well and should do well on oral Bactrim at home. Have given her recommendations for followup, she agrees to return within 48 hours for recheck.  Discharge Prescriptions include:  Naprosyn Bactrim        Vida Roller, MD 09/14/11 [phone]

## 2012-05-18 ENCOUNTER — Encounter (HOSPITAL_COMMUNITY): Payer: Self-pay | Admitting: *Deleted

## 2012-05-18 ENCOUNTER — Emergency Department (HOSPITAL_COMMUNITY): Payer: 59

## 2012-05-18 ENCOUNTER — Emergency Department (HOSPITAL_COMMUNITY)
Admission: EM | Admit: 2012-05-18 | Discharge: 2012-05-19 | Disposition: A | Discharge status: Home / Self Care | Payer: 59 | Attending: Emergency Medicine | Admitting: Emergency Medicine

## 2012-05-18 DIAGNOSIS — D72829 Elevated white blood cell count, unspecified: Secondary | ICD-10-CM | POA: Insufficient documentation

## 2012-05-18 DIAGNOSIS — Z8719 Personal history of other diseases of the digestive system: Secondary | ICD-10-CM | POA: Insufficient documentation

## 2012-05-18 DIAGNOSIS — Z9889 Other specified postprocedural states: Secondary | ICD-10-CM | POA: Insufficient documentation

## 2012-05-18 DIAGNOSIS — K5090 Crohn's disease, unspecified, without complications: Secondary | ICD-10-CM | POA: Insufficient documentation

## 2012-05-18 DIAGNOSIS — Z3202 Encounter for pregnancy test, result negative: Secondary | ICD-10-CM | POA: Insufficient documentation

## 2012-05-18 DIAGNOSIS — Z87891 Personal history of nicotine dependence: Secondary | ICD-10-CM | POA: Insufficient documentation

## 2012-05-18 DIAGNOSIS — R112 Nausea with vomiting, unspecified: Secondary | ICD-10-CM | POA: Insufficient documentation

## 2012-05-18 DIAGNOSIS — Z79899 Other long term (current) drug therapy: Secondary | ICD-10-CM | POA: Insufficient documentation

## 2012-05-18 DIAGNOSIS — Z862 Personal history of diseases of the blood and blood-forming organs and certain disorders involving the immune mechanism: Secondary | ICD-10-CM | POA: Insufficient documentation

## 2012-05-18 LAB — COMPREHENSIVE METABOLIC PANEL
ALT: 14 U/L (ref 0–35)
CO2: 25 mEq/L (ref 19–32)
Calcium: 8.8 mg/dL (ref 8.4–10.5)
Chloride: 101 mEq/L (ref 96–112)
GFR calc Af Amer: >90 mL/min (ref >90)
GFR calc non Af Amer: >90 mL/min (ref >90)
Glucose, Bld: 167 mg/dL — ABNORMAL HIGH (ref 70–99)
Sodium: 135 mEq/L (ref 135–145)
Total Bilirubin: 0.3 mg/dL (ref 0.3–1.2)

## 2012-05-18 LAB — CBC WITH DIFFERENTIAL/PLATELET
Eosinophils Relative: 0 % (ref 0–5)
HCT: 42.4 % (ref 36.0–46.0)
Lymphocytes Relative: 8 % — ABNORMAL LOW (ref 12–46)
Lymphs Abs: 1.5 10*3/uL (ref 0.7–4.0)
MCV: 92 fL (ref 78.0–100.0)
Monocytes Absolute: 0.6 10*3/uL (ref 0.1–1.0)
RBC: 4.61 MIL/uL (ref 3.87–5.11)
WBC: 18.1 10*3/uL — ABNORMAL HIGH (ref 4.0–10.5)

## 2012-05-18 LAB — LIPASE, BLOOD: Lipase: 18 U/L (ref 11–59)

## 2012-05-18 MED ORDER — MORPHINE SULFATE 4 MG/ML IJ SOLN
4.0000 mg | Freq: Once | INTRAMUSCULAR | Status: AC
  Administered 2012-05-18: 4 mg via INTRAVENOUS
  Filled 2012-05-18: qty 1

## 2012-05-18 MED ORDER — ONDANSETRON HCL 4 MG/2ML IJ SOLN
4.0000 mg | Freq: Once | INTRAMUSCULAR | Status: AC
  Administered 2012-05-18: 4 mg via INTRAVENOUS
  Filled 2012-05-18: qty 2

## 2012-05-18 MED ORDER — SODIUM CHLORIDE 0.9 % IV SOLN
1000.0000 mL | Freq: Once | INTRAVENOUS | Status: AC
  Administered 2012-05-18: 1000 mL via INTRAVENOUS

## 2012-05-18 NOTE — ED Notes (Signed)
Pt wants to wait until she is laying down before getting labs drawn.

## 2012-05-18 NOTE — ED Notes (Signed)
Pt was not able to use restroom.  Will check again in 30 minutes.

## 2012-05-18 NOTE — ED Provider Notes (Signed)
History     CSN: [id number]  Arrival date & time 05/18/12  2025   First MD Initiated Contact with Patient 05/18/12 2155      Chief Complaint  Patient presents with  . Abdominal Pain    (Consider location/radiation/quality/duration/timing/severity/associated sxs/prior treatment) HPI Comments: Patient is a 32 year old female with a past medical history of Crohn's disease who presents with abdominal pain that started this evening. The pain is located in her epigastrium and ride side abdomen and does not radiate. The pain is described as cramping and severe. The pain started gradually and progressively worsened since the onset. No alleviating/aggravating factors. The patient has tried nothing for symptoms without relief. Associated symptoms include nausea and vomiting. Patient denies fever, headache, diarrhea, chest pain, SOB, dysuria, constipation, abnormal vaginal bleeding/discharge. Patient reports she is unable to pass gas since the pain started.      Past Medical History  Diagnosis Date  . Iron deficiency anemia   . Acute pancreatitis   . Crohn disease     Past Surgical History  Procedure Laterality Date  . Umbilical hernia repair    . Small intestine surgery      with right hemicolectomy-in [place]    Family History  Problem Relation Age of Onset  . Colon cancer Neg Hx   . Crohn's disease Cousin   . Diabetes Father     History  Substance Use Topics  . Smoking status: Former Games developer  . Smokeless tobacco: Never Used  . Alcohol Use: Yes     Comment: occasional, last ETOH x 2 wks ago  from 05/18/12    OB History   Grav Para Term Preterm Abortions TAB SAB Ect Mult Living                  Review of Systems  Gastrointestinal: Positive for nausea, vomiting and abdominal pain.  All other systems reviewed and are negative.    Allergies  Penicillins  Home Medications   Current Outpatient Rx  Name  Route  Sig  Dispense  Refill  . acetaminophen (TYLENOL) 325 MG  tablet   Oral   Take 650 mg by mouth every 6 (six) hours as needed for pain.         Marland Kitchen azaTHIOprine (IMURAN) 50 MG tablet      50 mg.           BP 125/66  Pulse 108  Temp(Src) 98.8 F (37.1 C) (Oral)  Resp 12  SpO2 100%  LMP 05/06/2012  Physical Exam  Nursing note and vitals reviewed. Constitutional: She is oriented to person, place, and time. She appears well-developed and well-nourished. No distress.  HENT:  Head: Normocephalic and atraumatic.  Eyes: Conjunctivae and EOM are normal.  Neck: Normal range of motion. Neck supple.  Cardiovascular: Normal rate and regular rhythm.  Exam reveals no gallop and no friction rub.   No murmur heard. Pulmonary/Chest: Effort normal and breath sounds normal. She has no wheezes. She has no rales. She exhibits no tenderness.  Abdominal: Soft. She exhibits no distension. There is tenderness. There is no rebound and no guarding.  Right sided tenderness to palpation. No peritoneal signs.  Musculoskeletal: Normal range of motion.  Neurological: She is alert and oriented to person, place, and time. Coordination normal.  Speech is goal-oriented. Moves limbs without ataxia.   Skin: Skin is warm and dry.  Psychiatric: She has a normal mood and affect. Her behavior is normal.    ED Course  Procedures (including critical care time)  Labs Reviewed  CBC WITH DIFFERENTIAL - Abnormal; Notable for the following:    WBC 18.1 (*)    Platelets 441 (*)    Neutrophils Relative 88 (*)    Neutro Abs 16.0 (*)    Lymphocytes Relative 8 (*)    All other components within normal limits  COMPREHENSIVE METABOLIC PANEL - Abnormal; Notable for the following:    Glucose, Bld 167 (*)    Albumin 3.4 (*)    All other components within normal limits  URINALYSIS, MICROSCOPIC ONLY - Abnormal; Notable for the following:    APPearance HAZY (*)    Glucose, UA 100 (*)    All other components within normal limits  LIPASE, BLOOD  POCT PREGNANCY, URINE   Ct  Abdomen Pelvis W Contrast  05/19/2012  *RADIOLOGY REPORT*  Clinical Data: Vomiting; leukocytosis.  History of Crohn's disease.  CT ABDOMEN AND PELVIS WITH CONTRAST  Technique:  Multidetector CT imaging of the abdomen and pelvis was performed following the standard protocol during bolus administration of intravenous contrast.  Contrast: 100 mL of Omnipaque 300 IV contrast  Comparison: CT of the abdomen and pelvis performed 08/05/2009  Findings: The visualized lung bases are clear.  The liver and spleen are unremarkable in appearance.  The gallbladder is within normal limits.  The pancreas and adrenal glands are unremarkable.  The kidneys are unremarkable in appearance.  There is no evidence of hydronephrosis.  No renal or ureteral stones are seen.  No perinephric stranding is appreciated.  The patient is status post resection of the distal ileum and ascending colon.  An ileocolic anastomosis is noted at the right upper quadrant.  The remaining mid to distal ileum, just proximal to the anastomosis, demonstrates diffuse wall thickening and mild soft tissue inflammation, with associated fecalization, reflecting underlying dysmotility.  The segment of inflamed mid to distal ileum measures perhaps 16 cm in length, compatible with exacerbation of Crohn's disease.  The more proximal small bowel is unremarkable in appearance.  The stomach is within normal limits.  No acute vascular abnormalities are seen.  The remaining colon is unremarkable in appearance.  The bladder is mildly distended and grossly unremarkable.  The uterus is within normal limits; an intrauterine device is noted in expected position at the fundus of the uterus.  The ovaries are relatively symmetric; no suspicious adnexal masses are seen.  No inguinal lymphadenopathy is seen.  No acute osseous abnormalities are identified.  IMPRESSION:  1.  Inflammation of the mid to distal ileum, just proximal to the patient's ileocolic anastomosis at the right upper  quadrant, with diffuse wall thickening and associated fecalization.  The segment of inflamed mid to distal ileum measures perhaps 16 cm in length, compatible with exacerbation of Crohn's disease.  Underlying fecalization reflects dysmotility. 2.  Otherwise unremarkable CT of the abdomen and pelvis.   Original Report Authenticated By: Tonia Ghent, M.D.    Dg Abd Acute W/chest  05/18/2012  *RADIOLOGY REPORT*  Clinical Data: Epigastric abdominal pain; lower right-sided abdominal pain.  Nausea and vomiting.  ACUTE ABDOMEN SERIES (ABDOMEN 2 VIEW & CHEST 1 VIEW)  Comparison: Abdominal radiograph performed 08/01/2003, and CT of the abdomen and pelvis performed 08/05/2009  Findings: The lungs are mildly hypoexpanded but appear grossly clear.  There is no evidence of focal opacification, pleural effusion or pneumothorax.  The cardiomediastinal silhouette is within normal limits.  The visualized bowel gas pattern is unremarkable.  Scattered stool and air are seen within the  colon; there is no evidence of small bowel dilatation to suggest obstruction.  No free intra-abdominal air is identified on the provided decubitus view.  No acute osseous abnormalities are seen; the sacroiliac joints are unremarkable in appearance.  An intrauterine device is noted overlying the pelvis.  IMPRESSION:  1.  Unremarkable bowel gas pattern; no free intra-abdominal air seen. 2.  Lungs mildly hypoexpanded but grossly clear.   Original Report Authenticated By: Tonia Ghent, M.D.      1. Exacerbation of Crohn's disease       MDM  11:01 PM Labs show elevated WBC. Patient will have pain and nausea medication. Patient will have plain film of abdomen to rule out bowel obstruction due to patient's complaint of inability to pass gas.   1:54 AM Plain film unremarkable. CT abdomen consistent with Crohn's flare. Patient will have pain medication here. Patient will be discharged with Prednisone taper, pain medication, and zofran. Vitals  stable. Patient reports relief. Patient instructed to return to the ED with worsening or concerning symptoms.       Emilia Beck, PA-C 05/19/12 0425

## 2012-05-18 NOTE — ED Notes (Signed)
Pt c/o vomiting and abdominal pain since 1900 today. Pt states "it feels like it is burning".  Vomiting x 2 episodes. Pt denies blood in stool.

## 2012-05-19 ENCOUNTER — Emergency Department (HOSPITAL_COMMUNITY): Payer: 59

## 2012-05-19 LAB — URINALYSIS, MICROSCOPIC ONLY
Bilirubin Urine: NEGATIVE
Ketones, ur: NEGATIVE mg/dL
Nitrite: NEGATIVE
Protein, ur: NEGATIVE mg/dL
Specific Gravity, Urine: 1.025 (ref 1.005–1.030)
Urobilinogen, UA: 0.2 mg/dL (ref 0.0–1.0)

## 2012-05-19 LAB — POCT PREGNANCY, URINE: Preg Test, Ur: NEGATIVE

## 2012-05-19 MED ORDER — IOHEXOL 300 MG/ML  SOLN
50.0000 mL | Freq: Once | INTRAMUSCULAR | Status: AC | PRN
  Administered 2012-05-19: 50 mL via ORAL

## 2012-05-19 MED ORDER — METHYLPREDNISOLONE SODIUM SUCC 125 MG IJ SOLR
125.0000 mg | Freq: Once | INTRAMUSCULAR | Status: AC
  Administered 2012-05-19: 125 mg via INTRAVENOUS
  Filled 2012-05-19: qty 2

## 2012-05-19 MED ORDER — MORPHINE SULFATE 4 MG/ML IJ SOLN
4.0000 mg | Freq: Once | INTRAMUSCULAR | Status: AC
  Administered 2012-05-19: 4 mg via INTRAVENOUS
  Filled 2012-05-19: qty 1

## 2012-05-19 MED ORDER — IOHEXOL 300 MG/ML  SOLN
100.0000 mL | Freq: Once | INTRAMUSCULAR | Status: AC | PRN
  Administered 2012-05-19: 100 mL via INTRAVENOUS

## 2012-05-19 MED ORDER — ONDANSETRON 4 MG PO TBDP: 4.0000 mg | ORAL_TABLET | Freq: Three times a day (TID) | ORAL | Status: DC | PRN

## 2012-05-19 MED ORDER — ONDANSETRON HCL 4 MG/2ML IJ SOLN
4.0000 mg | Freq: Once | INTRAMUSCULAR | Status: AC
  Administered 2012-05-19: 4 mg via INTRAVENOUS
  Filled 2012-05-19: qty 2

## 2012-05-19 MED ORDER — OXYCODONE-ACETAMINOPHEN 5-325 MG PO TABS: 1.0000 | ORAL_TABLET | ORAL | Status: DC | PRN

## 2012-05-19 MED ORDER — PREDNISONE 10 MG PO TABS: 50.0000 mg | ORAL_TABLET | Freq: Every day | ORAL | Status: DC

## 2012-05-20 NOTE — ED Provider Notes (Signed)
Medical screening examination/treatment/procedure(s) were performed by non-physician practitioner and as supervising physician I was immediately available for consultation/collaboration.   Richardean Canal, MD 05/20/12 2259

## 2012-07-27 ENCOUNTER — Other Ambulatory Visit: Payer: Self-pay | Admitting: Internal Medicine

## 2012-07-30 NOTE — Telephone Encounter (Signed)
NEEDS OFFICE VISIT FOR ANY FURTHER REFILLS! 

## 2012-08-15 ENCOUNTER — Other Ambulatory Visit: Payer: Self-pay | Admitting: Certified Nurse Midwife

## 2012-10-19 ENCOUNTER — Telehealth: Payer: Self-pay | Admitting: Internal Medicine

## 2012-10-19 MED ORDER — AZATHIOPRINE 50 MG PO TABS: 75.0000 mg | ORAL_TABLET | Freq: Every day | ORAL | Status: DC

## 2012-10-19 NOTE — Telephone Encounter (Signed)
Patient has been scheduled for an office visit with Doug Sou, PA for crohns follow up (patient not seen in over 1 year). I have sent her enough medication to get her through until her visit with Shanda Bumps. Patient verbalizes understanding of the above.

## 2012-10-24 ENCOUNTER — Ambulatory Visit: Payer: Managed Care, Other (non HMO) | Admitting: Gastroenterology

## 2012-12-04 ENCOUNTER — Ambulatory Visit: Payer: Managed Care, Other (non HMO) | Admitting: Internal Medicine

## 2012-12-18 ENCOUNTER — Encounter: Payer: Self-pay | Admitting: Internal Medicine

## 2012-12-18 ENCOUNTER — Ambulatory Visit (INDEPENDENT_AMBULATORY_CARE_PROVIDER_SITE_OTHER): Payer: 59 | Admitting: Internal Medicine

## 2012-12-18 ENCOUNTER — Other Ambulatory Visit (INDEPENDENT_AMBULATORY_CARE_PROVIDER_SITE_OTHER): Payer: 59

## 2012-12-18 VITALS — BP 92/60 | Ht 67.75 in | Wt 226.1 lb

## 2012-12-18 DIAGNOSIS — Z23 Encounter for immunization: Secondary | ICD-10-CM

## 2012-12-18 DIAGNOSIS — D849 Immunodeficiency, unspecified: Secondary | ICD-10-CM

## 2012-12-18 DIAGNOSIS — K5090 Crohn's disease, unspecified, without complications: Secondary | ICD-10-CM

## 2012-12-18 DIAGNOSIS — R933 Abnormal findings on diagnostic imaging of other parts of digestive tract: Secondary | ICD-10-CM

## 2012-12-18 LAB — HEPATITIS B SURFACE ANTIGEN: Hepatitis B Surface Ag: NEGATIVE

## 2012-12-18 LAB — CBC WITH DIFFERENTIAL/PLATELET
Basophils Relative: 0.4 % (ref 0.0–3.0)
Eosinophils Absolute: 0.1 10*3/uL (ref 0.0–0.7)
Eosinophils Relative: 1.2 % (ref 0.0–5.0)
HCT: 40.5 % (ref 36.0–46.0)
Hemoglobin: 13.6 g/dL (ref 12.0–15.0)
Lymphs Abs: 1.7 10*3/uL (ref 0.7–4.0)
MCV: 92.4 fl (ref 78.0–100.0)
Monocytes Absolute: 0.4 10*3/uL (ref 0.1–1.0)
Neutro Abs: 7.6 10*3/uL (ref 1.4–7.7)
Neutrophils Relative %: 77.2 % — ABNORMAL HIGH (ref 43.0–77.0)
Platelets: 398 10*3/uL (ref 150.0–400.0)
RBC: 4.39 Mil/uL (ref 3.87–5.11)
RDW: 14.2 % (ref 11.5–14.6)
WBC: 9.9 10*3/uL (ref 4.5–10.5)

## 2012-12-18 LAB — IBC PANEL: Saturation Ratios: 32.9 % (ref 20.0–50.0)

## 2012-12-18 LAB — SEDIMENTATION RATE: Sed Rate: 12 mm/hr (ref 0–22)

## 2012-12-18 LAB — HEPATITIS B SURFACE ANTIBODY,QUALITATIVE

## 2012-12-18 MED ORDER — CLONAZEPAM 1 MG PO TABS: 1.0000 mg | ORAL_TABLET | Freq: Three times a day (TID) | ORAL | Status: DC | PRN

## 2012-12-18 MED ORDER — AZATHIOPRINE 50 MG PO TABS: 100.0000 mg | ORAL_TABLET | Freq: Every day | ORAL | Status: DC

## 2012-12-18 MED ORDER — PREDNISONE 10 MG PO TABS: 10.0000 mg | ORAL_TABLET | ORAL | Status: DC

## 2012-12-18 NOTE — Progress Notes (Signed)
Heather Foley 13-Nov-1980 MRN [id number]   History of Present Illness:  This is a 32 year old [place] American female with Crohn's disease diagnosed in June 2010 in [place], when she presented with a mid transverse colon stricture. She had a terminal ileum resection and ileocolic anastomosis at [facility] in July 2010, and she was subsequently hospitalized in [place] in May 2011 with a flare up. A CT scan at that time showed inflammation in the duodenum and ileocolic anastomosis. A colonoscopy in May 2011 showed mildly narrowed ileocolic anastomosis. Biopsies showed chronic active colitis. Random colon biopsies showed normal mucosa. She has been on Imuran 50 mg a day. There was an episode of acute nausea and vomiting in March 2014 and again about 3 weeks ago. She took prednisone starting at 60 mg for 2 days and tapering down by 10 mg every 2 days. She is currently feeling well. 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 She is a single parent.There has been no back pain, abdominal pain or aphthous stomatitis. She complains of stress and anxiety with associated binge eating. She would like to see a psychologist to assess her  For eating disorder.   Past Medical History  Diagnosis Date  . Iron deficiency anemia   . Acute pancreatitis   . Crohn disease    Past Surgical History  Procedure Laterality Date  . Umbilical hernia repair    . Small intestine surgery      with right hemicolectomy-in [place]    reports that she has quit smoking. She has never used smokeless tobacco. She reports that  drinks alcohol. She reports that she does not use illicit drugs. family history includes Crohn's disease in her cousin; Diabetes in her father. There is no history of Colon cancer. Allergies  Allergen Reactions  . Penicillins Other (See Comments)    childhood        Review of Systems: Negative for heartburn, diarrhea or rectal bleeding  The remainder of the 10  point ROS is negative except as outlined in H&P   Physical Exam: General appearance  Well developed, in no distress. Overweight Eyes- non icteric. HEENT nontraumatic, normocephalic. Mouth no lesions, tongue papillated, no cheilosis. Neck supple without adenopathy, thyroid palpable, no carotid bruits, no JVD. Lungs Clear to auscultation bilaterally. Cor normal S1, normal S2, regular rhythm, no murmur,  quiet precordium. Abdomen: Obese soft nontender with normal active bowel sounds. No distention. No tympany.  Rectal: Normal perianal area. Scant Hemoccult-negative stool. Extremities no pedal edema. Skin no lesions. Neurological alert and oriented x 3. Psychological normal mood and affect.  Assessment and Plan:  Problem #64 32 year old [place] American female with Crohn's disease of 4 years duration with  mild narrowing at the ileocolic anastomosis as per her last colonoscopy. Her most recent CT scan of the abdomen 05/2012 showed inflammatory changes at the ileocolic anastomosis of16 cm long segment of the distal ileum  Consistent  with Crohn's disease. We have discussed the use of biologicals such as Humira but she is not interested in the use biologicals at this time. We will therefore increase her Imuran to 100 mg daily. We will give her a pneumococcal vaccine. She refuses the flu vaccine. We will recheck her hepatitis serologies. She is not a smoker. He will obtain lab tests today including CBC, sedimentation rate, B12, iron studies and hepatitis serologies. We will also refill her Imuran and start her on Klonopin 1mg  when necessary for anxiety. We will give  her contact information for Dr Dellia Cloud or one of his colleages with regards to obcessive eating. A recall colonoscopy will be due in May 2016 or earlier if she has more flareups.   12/18/2012 Heather Foley

## 2012-12-18 NOTE — Patient Instructions (Addendum)
We have sent the following medications to your pharmacy for you to pick up at your convenience: Imuran 100 mg (2 tablets daily) Clonazepam Prednisone   We have given you a pneumonia (pneumovax) vaccine today. You may experience a small amount of swelling and redness at the injection site. This is normal. Should you experience these symptoms, please apply ice to the injection area for 10-15 minutes every 2-3 hours. However, should these symptoms or any other symptoms related to the injection concern you, please call our office at [phone].  Your physician has requested that you go to the basement for the following lab work before leaving today: CBC, Sed Rate, B12, IBC, Hepatitis B serologies, Hepatitis A  Please contact [facility] at [phone] to discuss your anxiety.  CC: Dr Caralyn Guile, Dr Leroy Libman, [place]  ( [facility])

## 2012-12-19 LAB — HEPATITIS A ANTIBODY, TOTAL: Hep A Total Ab: POSITIVE — AB

## 2012-12-24 ENCOUNTER — Ambulatory Visit (INDEPENDENT_AMBULATORY_CARE_PROVIDER_SITE_OTHER): Payer: 59 | Admitting: Psychology

## 2012-12-24 DIAGNOSIS — F4323 Adjustment disorder with mixed anxiety and depressed mood: Secondary | ICD-10-CM

## 2012-12-25 ENCOUNTER — Telehealth: Payer: Self-pay | Admitting: Internal Medicine

## 2012-12-25 NOTE — Telephone Encounter (Signed)
Patient was given tenuate dospan at her 04/26/10 office visit. She requests more medication that is comparable to this but states that the tenuate is "too expensive." She states that she and Dr Juanda Chance had a conversation about her being on something for an appetite suppressant at the time of her last office visit. Dr Juanda Chance, please advise.

## 2012-12-25 NOTE — Telephone Encounter (Signed)
I put her on Klonopin for anxiety and a referral to Dr Dellia Cloud per her request. We did not discuss any apetite supressants. I would have remembered that. I am not sure what is cheaper. Phentermine 37.5 mg, #30, 1 po qd, 1 refill.she may try that. Will refill only if she shows weight loss.

## 2012-12-26 MED ORDER — AMBULATORY NON FORMULARY MEDICATION: Status: DC

## 2012-12-26 NOTE — Telephone Encounter (Signed)
Patient states that she actually has another medication for appetite suppresent, not tennuate dospan. She wants to know if Dr Juanda Chance is willing to send her refills of tennuate dospan as before rather than phentermine. Dr Juanda Chance, are you okay with this being refilled rather than phentermine? If so, I will send #30 and advise that she needs to call us back with her beginning and ending weight for the month before we will prescribe any more.

## 2012-12-26 NOTE — Telephone Encounter (Signed)
rx sent. Patient advised of Dr Regino Schultze recommendations to call back with weight in 1 month. She verbalizes understanding.

## 2012-12-26 NOTE — Telephone Encounter (Signed)
Yes, please, send Twennuate Dospan 75mg , #30, 1 po qd, 1 refill

## 2013-06-03 ENCOUNTER — Telehealth: Payer: Self-pay | Admitting: Internal Medicine

## 2013-06-03 MED ORDER — METRONIDAZOLE 250 MG PO TABS: ORAL_TABLET | ORAL | Status: DC

## 2013-06-03 MED ORDER — PREDNISONE 10 MG PO TABS: 10.0000 mg | ORAL_TABLET | ORAL | Status: DC

## 2013-06-03 NOTE — Telephone Encounter (Signed)
Patient calling to report she is having a flare. Started on Friday, with frequency and pain. States the pain is on the right side above and below the belly button. Reports diarrhea and mucous with 4-5 times yesterday. Denies bleeding. States she usually has increased hunger with flares and she is having this also. Slight nausea. She started Prednisone 60 mg on Saturday. She is taking Azathioprine 2 tablets/day but does admit to "takine only one/day or missing a day entirely sometimes." Please, advise.

## 2013-06-03 NOTE — Telephone Encounter (Signed)
Left a message for patient to call me. Rx sent to pharmacy.

## 2013-06-03 NOTE — Telephone Encounter (Signed)
Please take Prednisone 40mg /day x 1 week, 30mg /day x 1 week, 20mg /day x 1 week, 15mg  x 1 week, 10 mgx1 week, 5 mgx1 week. Also start Flagyl 250 mg po tid x 1 week, #21. Continue Imuran 100mg  daily. OV  Or check back with us in 1 week

## 2013-06-04 NOTE — Telephone Encounter (Signed)
Patient given recommendations. Rx sent to pharmacy. Patient will call next week with update.

## 2013-06-11 ENCOUNTER — Telehealth: Payer: Self-pay | Admitting: Internal Medicine

## 2013-06-11 MED ORDER — FLUCONAZOLE 100 MG PO TABS: ORAL_TABLET | ORAL | Status: DC

## 2013-06-11 NOTE — Telephone Encounter (Signed)
Patient is feeling much better. She now has a yeast infection and is asking for Diflucan. Please,advise.

## 2013-06-11 NOTE — Telephone Encounter (Signed)
Please send Diflucan 100mg , #3, 1 po qd x3,

## 2013-06-11 NOTE — Telephone Encounter (Signed)
Rx sent to pharmacy. Patient notified. 

## 2013-06-25 ENCOUNTER — Other Ambulatory Visit: Payer: Self-pay | Admitting: Internal Medicine

## 2013-08-01 ENCOUNTER — Other Ambulatory Visit (INDEPENDENT_AMBULATORY_CARE_PROVIDER_SITE_OTHER): Payer: 59

## 2013-08-01 ENCOUNTER — Ambulatory Visit (INDEPENDENT_AMBULATORY_CARE_PROVIDER_SITE_OTHER): Payer: 59 | Admitting: Physician Assistant

## 2013-08-01 ENCOUNTER — Telehealth: Payer: Self-pay | Admitting: Internal Medicine

## 2013-08-01 ENCOUNTER — Encounter: Payer: Self-pay | Admitting: Physician Assistant

## 2013-08-01 VITALS — BP 124/64 | HR 88 | Ht 68.0 in | Wt 246.4 lb

## 2013-08-01 DIAGNOSIS — K5090 Crohn's disease, unspecified, without complications: Secondary | ICD-10-CM

## 2013-08-01 LAB — BASIC METABOLIC PANEL
BUN: 14 mg/dL (ref 6–23)
CHLORIDE: 106 meq/L (ref 96–112)
CO2: 26 mEq/L (ref 19–32)
CREATININE: 0.9 mg/dL (ref 0.4–1.2)
Calcium: 9.2 mg/dL (ref 8.4–10.5)
GFR: 98.9 mL/min (ref >60.00)
GLUCOSE: 101 mg/dL — AB (ref 70–99)
POTASSIUM: 4.1 meq/L (ref 3.5–5.1)
Sodium: 140 mEq/L (ref 135–145)

## 2013-08-01 LAB — CBC WITH DIFFERENTIAL/PLATELET
BASOS PCT: 0.4 % (ref 0.0–3.0)
Basophils Absolute: 0.1 10*3/uL (ref 0.0–0.1)
EOS PCT: 0.2 % (ref 0.0–5.0)
Eosinophils Absolute: 0 10*3/uL (ref 0.0–0.7)
HCT: 40.3 % (ref 36.0–46.0)
HEMOGLOBIN: 13.5 g/dL (ref 12.0–15.0)
Lymphocytes Relative: 5.1 % — ABNORMAL LOW (ref 12.0–46.0)
Lymphs Abs: 0.9 10*3/uL (ref 0.7–4.0)
MCHC: 33.5 g/dL (ref 30.0–36.0)
MCV: 92.3 fl (ref 78.0–100.0)
MONO ABS: 0.3 10*3/uL (ref 0.1–1.0)
Monocytes Relative: 2 % — ABNORMAL LOW (ref 3.0–12.0)
NEUTROS ABS: 15.5 10*3/uL — AB (ref 1.4–7.7)
Neutrophils Relative %: 92.3 % — ABNORMAL HIGH (ref 43.0–77.0)
Platelets: 468 10*3/uL — ABNORMAL HIGH (ref 150.0–400.0)
RBC: 4.36 Mil/uL (ref 3.87–5.11)
RDW: 15.7 % — ABNORMAL HIGH (ref 11.5–15.5)
WBC: 16.8 10*3/uL — AB (ref 4.0–10.5)

## 2013-08-01 MED ORDER — ALPRAZOLAM 0.25 MG PO TABS: ORAL_TABLET | ORAL | Status: DC

## 2013-08-01 MED ORDER — PREDNISONE 10 MG PO TABS: ORAL_TABLET | ORAL | Status: DC

## 2013-08-01 MED ORDER — DICYCLOMINE HCL 10 MG PO CAPS: ORAL_CAPSULE | ORAL | Status: DC

## 2013-08-01 MED ORDER — TRAMADOL HCL 50 MG PO TABS: ORAL_TABLET | ORAL | Status: DC

## 2013-08-01 NOTE — Patient Instructions (Signed)
Please go to the basement level to have your labs drawn.   We have given you prescriptions to take to your pharmacy. 1. Xanax 2. Prednisone 10 g 3. Ultram 50 mg  We sent electronically to [facility] [place] [address].    Come back in our office Tuesday for a TB skin test.  Time is 8:00 am .

## 2013-08-01 NOTE — Telephone Encounter (Signed)
Patient states she has had problems off and on since March. She has been on a Prednisone taper. States she never really got much better. She reports dark, loose stools, abdominal pain and back pain. Pain is on right side. Scheduled with Mike GipAmy Esterwood, PA today at 1:30 PM.

## 2013-08-01 NOTE — Progress Notes (Signed)
Reviewed and agree. Please add Flagyl 250 mg, #30 1 po tid x 10 days.

## 2013-08-01 NOTE — Progress Notes (Signed)
Subjective:    Patient ID: Heather SheldonAleisha Foley, female    DOB: 07/29/1980, 33 y.o.   MRN: [id number]  HPI Heather Foley is a pleasant 33 year old African -American female known to Dr. Vincent Grosore Brodie . She was last seen in the office in October of 2014. She was diagnosed with Crohn's ileocolitis in 2010 while living in [place] and underwent a resection of the terminal ileum and ileocolonic anastomosis [facility] in 2010. She was hospitalized here with a flare in 2011 area When seen in October of 2014 she was stable and her Imuran was increased to 100 mg by mouth daily. Last colonoscopy was done in May of 2011 showed a mildly narrowed ileocolic anastomosis and biopsies showed chronic active colitis. CT scan in March of 2014 showed inflammatory changes at the ileocecal anastomosis over about 16 cm of the terminal ileum. Patient a cold here in March 2015 with an exacerbation of symptoms with abdominal cramping pain loose stools and was started back on prednisone at 40 mg by mouth daily. She was tapered slowly and is currently still on 10 mg by mouth daily. She called today because she's having an exacerbation of pain cramping and diarrhea and is also noted some darker-looking stools. She has not had any documented fever or chills. She does complain of some fatigue. She's been having 3-4 loose bowel movements per day and has seen some darker stools over the past 3 days. No nausea or vomiting. She has been taking ibuprofen 600 mg twice daily for the abdominal pain. She says she feels good when she is on higher doses of prednisone but when she gets below 20 mg she starts having recurrent symptoms.. Biologic pus were discussed at her last office visit and at that time she was opposed to twice a day a bit today she says she's open to it because she sees that she's not getting better with just prednisone. Hepatitis serologies were negative and she did get a Pneumovax that her last office visit.    Review of Systems    Constitutional: Positive for appetite change and fatigue.  HENT: Negative.   Eyes: Negative.   Respiratory: Negative.   Cardiovascular: Negative.   Gastrointestinal: Positive for abdominal pain and diarrhea.  Endocrine: Negative.   Genitourinary: Negative.   Musculoskeletal: Negative.   Allergic/Immunologic: Negative.   Neurological: Negative.   Hematological: Negative.   Psychiatric/Behavioral: Negative.    Outpatient Prescriptions Prior to Visit  Medication Sig Dispense Refill  . acetaminophen (TYLENOL) 325 MG tablet Take 650 mg by mouth every 6 (six) hours as needed for pain.      Marland Kitchen. AMBULATORY NON FORMULARY MEDICATION Medication Name: Tenuate Dospan 175 mg-Take 1 tablet by mouth once daily.  30 tablet  1  . azaTHIOprine (IMURAN) 50 MG tablet Take 2 tablets (100 mg total) by mouth daily.  60 tablet  3  . clonazePAM (KLONOPIN) 1 MG tablet Take 1 tablet (1 mg total) by mouth every 8 (eight) hours as needed for anxiety.  30 tablet  3  . fluconazole (DIFLUCAN) 100 MG tablet Take one po daily x 3 days  3 tablet  0  . Lorcaserin HCl (BELVIQ) 10 MG TABS Take 1 tablet by mouth 2 (two) times daily.      . metroNIDAZOLE (FLAGYL) 250 MG tablet Take on po TID x 1 week  21 tablet  0  . predniSONE (DELTASONE) 10 MG tablet Take 1 tablet (10 mg total) by mouth as directed.  100 tablet  1  No facility-administered medications prior to visit.   Allergies  Allergen Reactions  . Penicillins Other (See Comments)    childhood   Patient Active Problem List   Diagnosis Date Noted  . ANEMIA, IRON DEFICIENCY 08/14/2009  . ANEMIA OF CHRONIC DISEASE 08/14/2009  . PANCREATITIS, ACUTE 08/14/2009  . NEPHROLITHIASIS, HX OF 03/14/2006  . CROHN'S DISEASE 03/15/2003   History  Substance Use Topics  . Smoking status: Former Games developer  . Smokeless tobacco: Never Used     Comment: only smokes socially 1-2  cigarettes a month  . Alcohol Use: Yes     Comment: occasional, last ETOH x 2 wks ago  from 05/18/12    family history includes Crohn's disease in her cousin; Diabetes in her father. There is no history of Colon cancer.     Objective:   Physical Exam    female in no acute distress, blood pressure 124/64 pulse 88 height 5 foot 8 weight 246. HEENT; nontraumatic normocephalic EOMI PERRLA sclera anicteric, Supple; no JVD, Cardiovascular; regular rate and rhythm with S1-S2 no murmur or gallop, Pulmonary; clear bilaterally, Abdomen; large soft she is tender in the right lower quadrant and right mid quadrant no guarding or rebound no palpable mass or hepatosplenomegaly bowel sounds are present, Rectal; exam not done, Extremities ;no clubbing cyanosis or edema skin warm and dry, Psych ;mood and affect appropriate        Assessment & Plan:  #31  33 year old female with Crohn's ileocolitis with no active anastomotic disease. She is requiring steroids for control of her symptoms despite being on in urine 100 mg by mouth daily. Patient presents today with exacerbation of abdominal pain cramping and increased frequency of stools as well as some darker-appearing stools.  #2 status post terminal ileal resection with ileocolic anastomosis 2010 #3 history of iron deficiency anemia #4 obesity #5 history of nephrolithiasis  Plan; CBC and BMET today Will increase prednisone to 30 mg by mouth daily x1 week then back down to 20 mg by mouth daily and she will remain at that dose until further instructed Continue Imuran 100 mg by mouth daily Add Bentyl 10 mg 3 times daily as needed for cramping Add Xanax 0.25 patient's take one half tablet at bedtime if needed for sleep while on higher dose steroids Stop ibuprofen Start Ultram 50 mg every 6-8 hours when necessary pain We discussed initiation of biologic-she is agreeable-she will come in next week for TB skin test and will start the preauthorization  process and get her scheduled for teaching for Humira injections Office followup with Dr. Juanda Chance

## 2013-08-06 ENCOUNTER — Other Ambulatory Visit: Payer: Self-pay | Admitting: *Deleted

## 2013-08-06 MED ORDER — METRONIDAZOLE 250 MG PO TABS: 250.0000 mg | ORAL_TABLET | Freq: Three times a day (TID) | ORAL | Status: DC

## 2013-08-07 ENCOUNTER — Ambulatory Visit (INDEPENDENT_AMBULATORY_CARE_PROVIDER_SITE_OTHER): Payer: 59 | Admitting: Internal Medicine

## 2013-08-07 DIAGNOSIS — K5090 Crohn's disease, unspecified, without complications: Secondary | ICD-10-CM

## 2013-08-09 LAB — TB SKIN TEST
Induration: 0 mm
TB SKIN TEST: NEGATIVE

## 2013-08-14 ENCOUNTER — Telehealth: Payer: Self-pay | Admitting: Internal Medicine

## 2013-08-14 NOTE — Telephone Encounter (Signed)
Patient Information:  Caller Name: Leatrice Jewelsleisha  Phone: [phone]  Patient: Morrie Sheldonvery, Tarina  Gender: Female  DOB: 04/26/1980  Age: [age] Years  PCP: Other  Pregnant: No  Office Follow Up:  Does the office need to follow up with this patient?: Yes  Instructions For The Office: Please call patient and let her know if she needs to go to the ED   Symptoms  Reason For Call & Symptoms: Started with vomiting on 08/13/13 at 1500. She was crampy during the night on 08/12/13. She is having loose dark tarry like stools. Afebrile- Hx Crohnes and is still taking Prednisone 30 mgs and Dicyclomine. Started on Flagyl yesterday for wbcs being elevated when labs last checked. She has had light vaginal bleeding since 07/28/13 and is changing pad/tampon 2-3 times daily. Last vomited at 0530 08/14/13. Appetite diminished but still taking fluids. Last voided this morning at 0850. She did a HPT on 07/28/13 and was negative. Mid Abdomen feels tender- above navel and toward upper right side. She has some pain radiating to back. Last tarry stool  was at 0500-08/14/13. Abdominal pain level - 3-4.   Reviewed Health History In EMR: Yes  Reviewed Medications In EMR: Yes  Reviewed Allergies In EMR: Yes  Reviewed Surgeries / Procedures: Yes  Date of Onset of Symptoms: 08/12/2013  Treatments Tried: Phenergan, Zofran, iron  Treatments Tried Worked: No OB / GYN:  LMP: 07/28/2013  Guideline(s) Used:  Vomiting  Abdominal Pain - Upper  Disposition Per Guideline:   Go to ED Now  Reason For Disposition Reached:   Blood in bowel movements (black/tarry or red)  Advice Given:  Reassurance:  Vomiting can be caused by many types of illnesses. It can be caused by a stomach flu virus. It can be caused by eating or drinking something that disagreed with your stomach.  Adults with vomiting need to stay hydrated. This is the most important thing. If you don't drink and replace lost fluids, you may get dehydrated.  You can treat vomiting, even  if there is mild dehydration, at home.  Clear Liquids:  Sip water or a rehydration drink (e.g., Gatorade or Powerade).  Other options: 1/2 strength flat lemon-lime soda or ginger ale.  After 4 hours without vomiting, increase the amount.  For Non-stop Vomiting, Try Sleeping:  Try to go to sleep (Reason: sleep often empties the stomach and relieves the need to vomit).  When you awaken, resume drinking liquids. Water works best initially.  Contagiousness:  You can return to work or school after vomiting and fever are gone.  Expected Course:  Vomiting from viral gastritis usually stops in 12 to 48 hours.  If diarrhea is present, it usually lasts for several days.  People with mild dehydration can usually treat themselves at home, by drinking more liquids.  People with moderate to severe dehydration may need medical care. signs of this include very dry mouth, dizziness, weakness, and decreased urination.  Call Back If:  Vomiting lasts for more than 2 days (48 hours)  Signs of dehydration occur  You become worse.  Patient Will Follow Care Advice:  YES

## 2013-08-15 ENCOUNTER — Telehealth: Payer: Self-pay | Admitting: Internal Medicine

## 2013-08-15 ENCOUNTER — Other Ambulatory Visit: Payer: Self-pay | Admitting: *Deleted

## 2013-08-15 ENCOUNTER — Other Ambulatory Visit: Payer: Self-pay | Admitting: Physician Assistant

## 2013-08-15 ENCOUNTER — Telehealth: Payer: Self-pay | Admitting: *Deleted

## 2013-08-15 MED ORDER — PREDNISONE 10 MG PO TABS: ORAL_TABLET | ORAL | Status: DC

## 2013-08-15 NOTE — Telephone Encounter (Signed)
I advised Heather Foley that we faxed her a prescription for the Ultram 50 mg.

## 2013-08-15 NOTE — Telephone Encounter (Signed)
See phone note for  6-4 Dr. Juanda Chance. Amy Esterwood PA-C gave me instrucitons for this patient on the Prednisone. I spoke to the patient today. She also needed a refill on the Ultram which Amy approved.

## 2013-08-15 NOTE — Telephone Encounter (Signed)
Andersen said she was not able to taper down on the prednisone. She has just had a virus and it exacerbated her crohn's.  Amy Esterwood PA said for me to tell her to take Prednisone 20 mg daily for 14 days then go to 15 mg until we know when and if she is starting on the biologic. Sending refill for Prednisone.

## 2013-08-21 ENCOUNTER — Telehealth: Payer: Self-pay | Admitting: Internal Medicine

## 2013-08-21 NOTE — Telephone Encounter (Signed)
Pt called wanting to know where she is supposed to get her Humira. Checked with Karen Kitchens and she states the papers have been faxed to the pharmacy and we are waiting to hear back from them. Pt aware.

## 2013-08-22 NOTE — Telephone Encounter (Signed)
Spoke with [facility] and patient's Humira copay is 40 %. They have given her a copay assistance card. She will need to get from [facility] and will need PA. Called PA at [phone] and started PA. Case number [id number]. [facility] # for rx when approved- [phone]. Will not know patient's cost until we have PA and rx is sent to [facility]. Patient is aware. She met with her ambassador yesterday.

## 2013-08-27 ENCOUNTER — Telehealth: Payer: Self-pay | Admitting: *Deleted

## 2013-08-27 NOTE — Telephone Encounter (Signed)
Patient is asking when she should have f/u with Dr. Juanda ChanceBrodie. Please, advise.

## 2013-08-27 NOTE — Telephone Encounter (Signed)
Left a message for patient to call to schedule OV next week.

## 2013-08-27 NOTE — Telephone Encounter (Signed)
Can you add her on for next week office? Thanx

## 2013-08-27 NOTE — Telephone Encounter (Signed)
Spoke with patient and scheduled OV on 09/04/13 at 11:00 AM.

## 2013-08-27 NOTE — Telephone Encounter (Signed)
Spoke with patient and told her I have talked with [facility] and her PA has been approved for one year and rx has been given to them. [facility] [phone](Sheyda Rph). She can call [phone] to verify shipping date.

## 2013-09-04 ENCOUNTER — Ambulatory Visit (INDEPENDENT_AMBULATORY_CARE_PROVIDER_SITE_OTHER): Payer: 59 | Admitting: Internal Medicine

## 2013-09-04 ENCOUNTER — Encounter: Payer: Self-pay | Admitting: Internal Medicine

## 2013-09-04 VITALS — BP 120/78 | HR 106 | Ht 68.0 in | Wt 241.0 lb

## 2013-09-04 DIAGNOSIS — D849 Immunodeficiency, unspecified: Secondary | ICD-10-CM

## 2013-09-04 DIAGNOSIS — K50919 Crohn's disease, unspecified, with unspecified complications: Secondary | ICD-10-CM

## 2013-09-04 DIAGNOSIS — R933 Abnormal findings on diagnostic imaging of other parts of digestive tract: Secondary | ICD-10-CM

## 2013-09-04 DIAGNOSIS — K5090 Crohn's disease, unspecified, without complications: Secondary | ICD-10-CM

## 2013-09-04 MED ORDER — PHENTERMINE HCL 37.5 MG PO CAPS: 37.5000 mg | ORAL_CAPSULE | ORAL | Status: DC

## 2013-09-04 MED ORDER — PREDNISONE 10 MG PO TABS: ORAL_TABLET | ORAL | Status: DC

## 2013-09-04 NOTE — Progress Notes (Signed)
Heather Foley 09/26/1980 [id number]  Note: This dictation was prepared with Dragon digital system. Any transcriptional errors that result from this procedure are unintentional.   History of Present Illness:  This is a 33 year old African American female with Crohn's disease of the ileum and colon since 2010. She is status post terminal ile[facility] in [facility]. She was hospitalized with a flareup in 2011. Her last colonoscopy in May 2011 showed a narrow ileocecal anastomosis. Biopsies showed chronic active colitis. A CT scan of the abdomen in March 2014 confirmed inflammatory changes of the distal ileum and at the anastomosis with a 16 cm segment of the terminal ileum. She has not had any symptoms of obstruction. She developed a flareup of her Crohn's disease 2 months ago and was started on a prednisone taper which initially helped but her symptoms returned after she discontinued prednisone. At that point, she agreed to start on biologicals. She was precertified and approved for a Humira induction regimen and maintenance dose of 40 mg every 2 weeks. She is also on Imuran 100 mg daily. Her hemoglobin is normal at 13.5. She is having loose stools but no bleeding. She has gained about 10 pounds and would like to take an appetite suppressant while taking prednisone.    Past Medical History  Diagnosis Date  . Iron deficiency anemia   . Acute pancreatitis   . Crohn disease     Past Surgical History  Procedure Laterality Date  . Umbilical hernia repair    . Small intestine surgery      with right hemicolectomy-in [place]    Allergies  Allergen Reactions  . Penicillins Other (See Comments)    childhood    Family history and social history have been reviewed.  Review of Systems: Negative for heartburn. Occasional right lower quadrant abdominal pain. Positive for diarrhea  The remainder of the 10 point ROS is negative except as outlined in the H&P  Physical  Exam: General Appearance Well developed, in no distress Eyes  Non icteric  HEENT  Non traumatic, normocephalic  Mouth No lesion, tongue papillated, no cheilosis Neck Supple without adenopathy, thyroid not enlarged, no carotid bruits, no JVD Lungs Clear to auscultation bilaterally COR Normal S1, normal S2, regular rhythm, no murmur, quiet precordium Abdomen obese soft tender in right lower quadrant. No palpable mass or rebound Rectal not done. Extremities No pedal edema Skin No lesions Neurological Alert and oriented x 3 Psychological Normal mood and affect  Assessment and Plan:   Problem #91 33 year old [place] female with recurrent Crohn's disease at the terminal ileum and ileocolic anastomosis. No symptoms of obstruction She will be starting on biologicalls today with an induction regimen followed by a maintenance dose of 40 mg every 2 weeks. She will also restart her prednisone 10 mg daily for the next several weeks until the Humira takes effect. I will see her in 3 months. She will continue Imuran. She is up-to-date on her TB skin test.    Heather Foley 09/04/2013

## 2013-09-04 NOTE — Patient Instructions (Signed)
Please follow up with Dr Juanda ChanceBrodie in 3 months.  We have sent the following medications to your pharmacy for you to pick up at your convenience: Phentermine 37.5 mg-Take 1 tablet by mouth every morning Prednisone 10 mg daily  CC:Dr Jordan HawksLlibre

## 2013-09-05 ENCOUNTER — Telehealth: Payer: Self-pay | Admitting: Internal Medicine

## 2013-09-05 MED ORDER — ADALIMUMAB 40 MG/0.8ML ~~LOC~~ AJKT: 1.0000 "pen " | AUTO-INJECTOR | Freq: Once | SUBCUTANEOUS | Status: DC

## 2013-09-05 NOTE — Telephone Encounter (Signed)
Rx sent 

## 2013-10-03 ENCOUNTER — Telehealth: Payer: Self-pay | Admitting: *Deleted

## 2013-10-03 NOTE — Telephone Encounter (Signed)
Message left to return call.  RE:  Scheduling recall AEX 

## 2013-11-12 IMAGING — CT CT ABD-PELV W/ CM
1 of 3 series · 13 of 32 positions shown, 18 images · IV contrast (OMNIPAQUE 300)
Comparison: CT of the abdomen and pelvis performed 08/05/2009

CLINICAL DATA: Vomiting; leukocytosis.  History of Crohn's disease.

CT ABDOMEN AND PELVIS WITH CONTRAST
TECHNIQUE: Multidetector CT imaging of the abdomen and pelvis was
performed following the standard protocol during bolus
administration of intravenous contrast.
Contrast: 100 mL of Omnipaque 300 IV contrast

[Series 2: abd/pel with · axial · 0.74mm/px · z∈[-471,-46]mm · 13 of 95 slices shown, 18 images]
[im 5/95  soft-tissue]
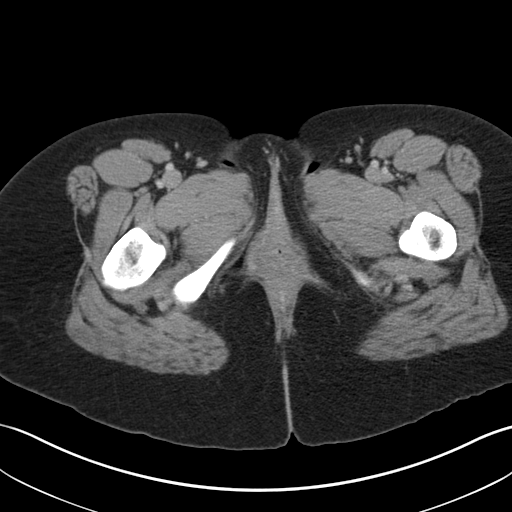
[im 5/95  bone]
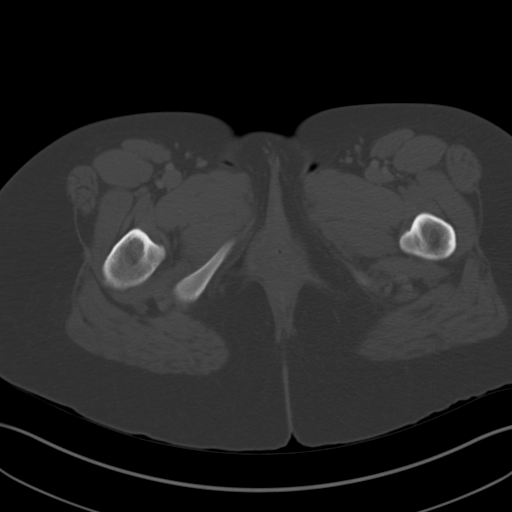
[im 15/95  soft-tissue]
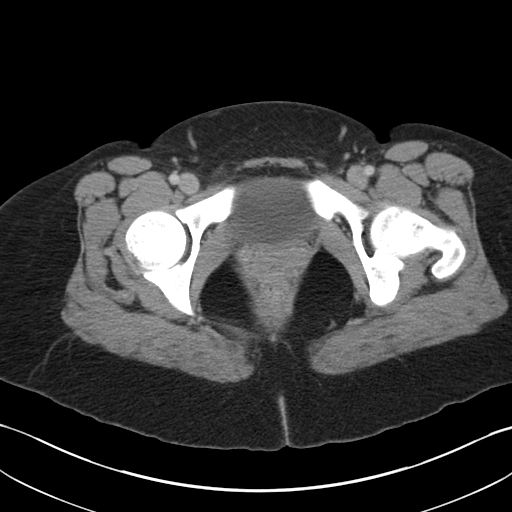
[im 20/95  soft-tissue]
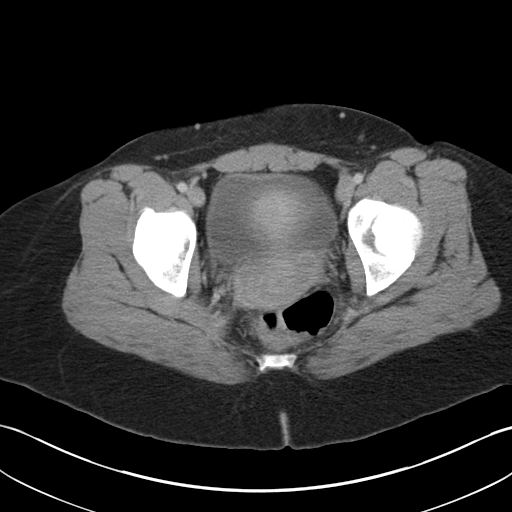
[im 30/95  soft-tissue]
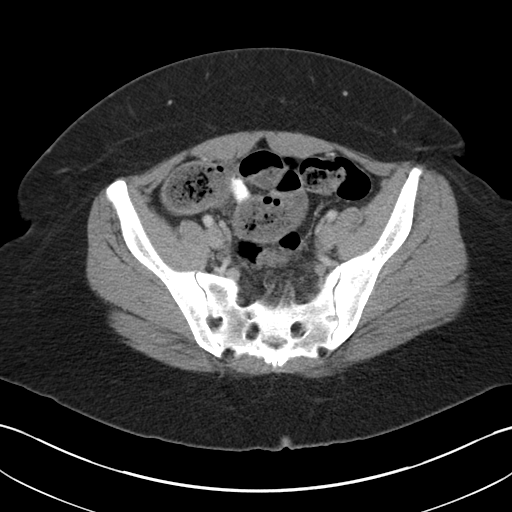
[im 35/95  soft-tissue]
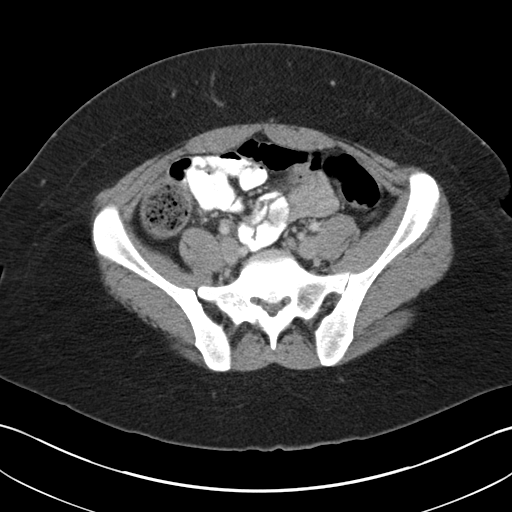
[im 45/95  soft-tissue]
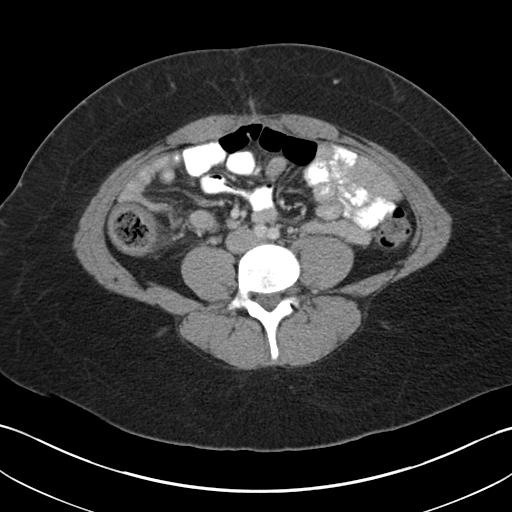
[im 50/95  soft-tissue]
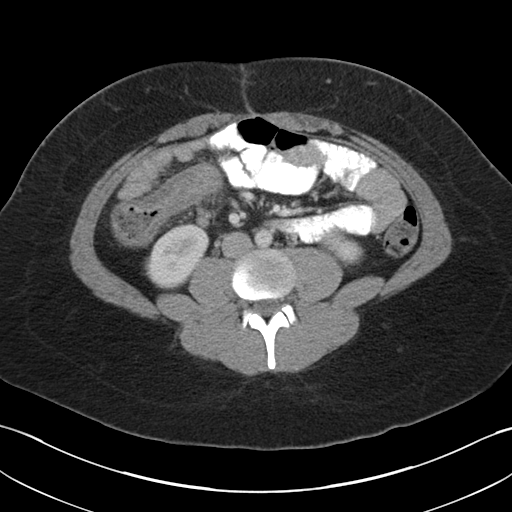
[im 60/95  soft-tissue]
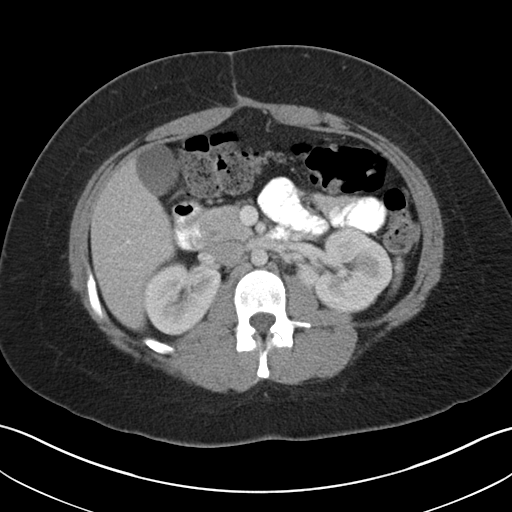
[im 65/95  soft-tissue]
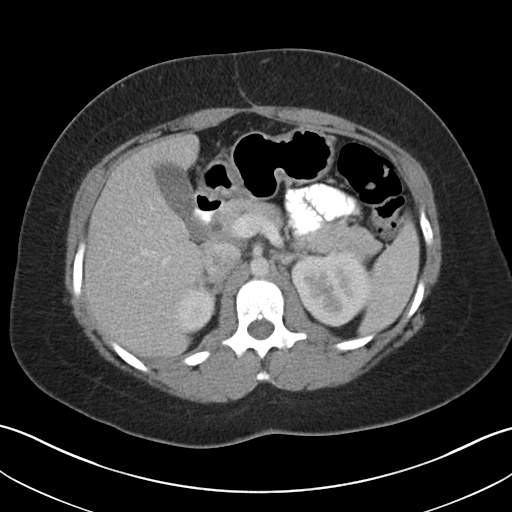
[im 65/95  bone]
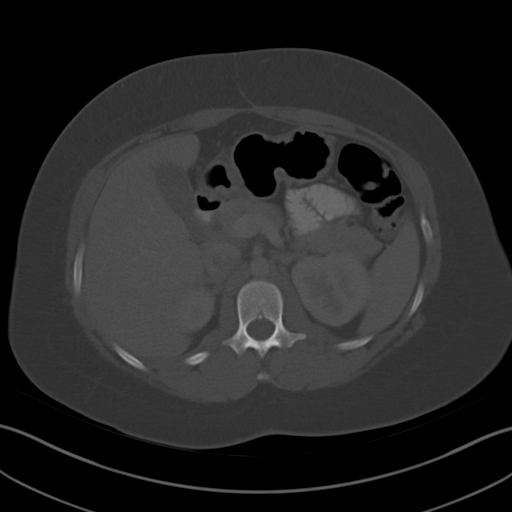
[im 75/95  soft-tissue]
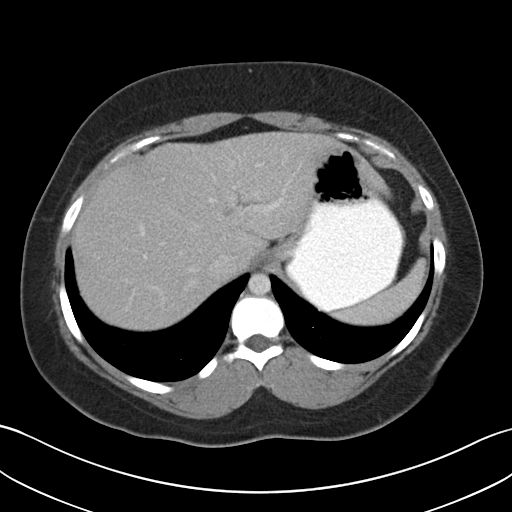
[im 75/95  lung]
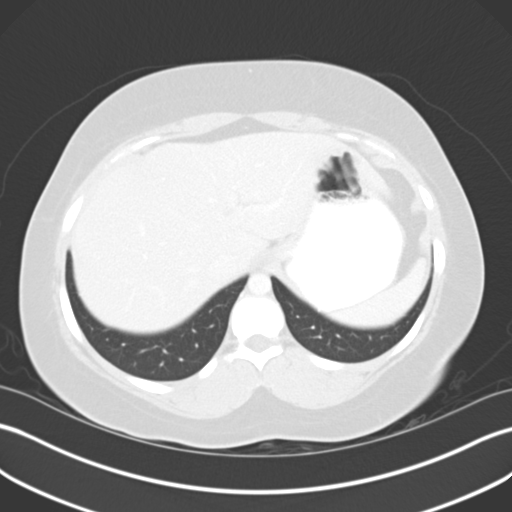
[im 80/95  soft-tissue]
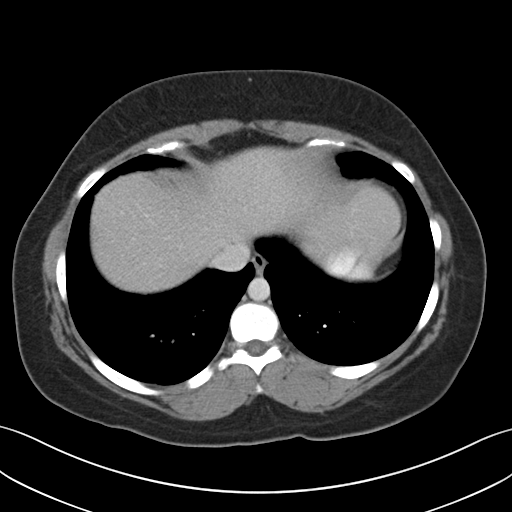
[im 80/95  lung]
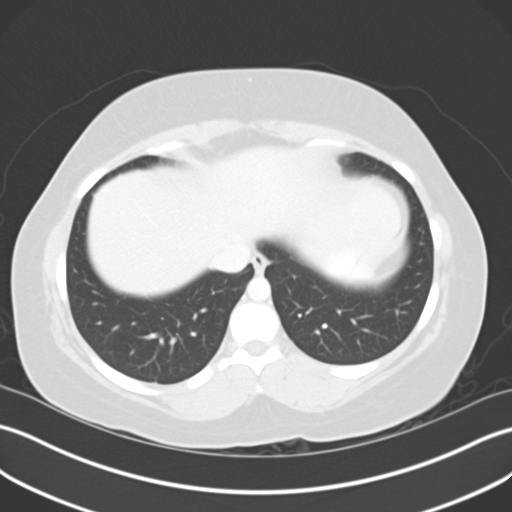
[im 85/95  lung]
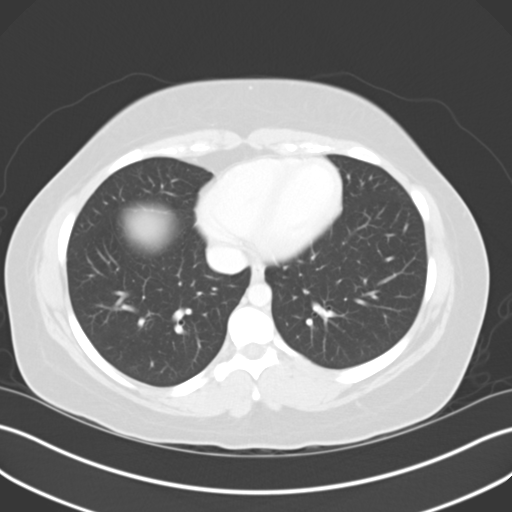
[im 90/95  soft-tissue]
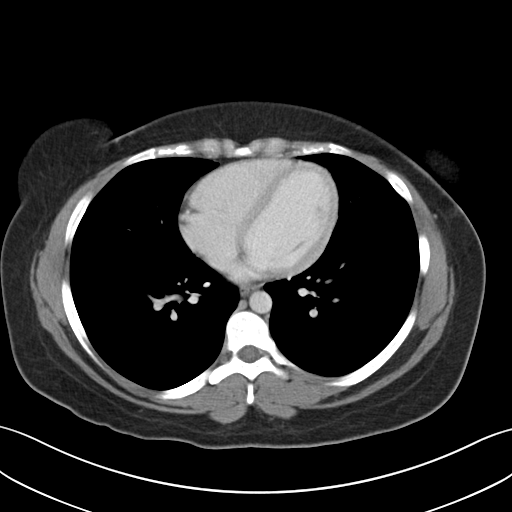
[im 90/95  lung]
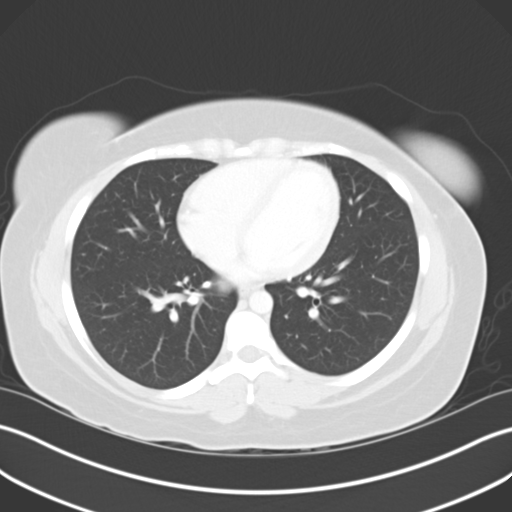

[13 of 32 positions shown; findings below may reference images not displayed]

FINDINGS: The visualized lung bases are clear.

The liver and spleen are unremarkable in appearance.  The
gallbladder is within normal limits.  The pancreas and adrenal
glands are unremarkable.

The kidneys are unremarkable in appearance.  There is no evidence
of hydronephrosis.  No renal or ureteral stones are seen.  No
perinephric stranding is appreciated.

The patient is status post resection of the distal ileum and
ascending colon.  An ileocolic anastomosis is noted at the right
upper quadrant.

The remaining mid to distal ileum, just proximal to the
anastomosis, demonstrates diffuse wall thickening and mild soft
tissue inflammation, with associated fecalization, reflecting
underlying dysmotility.  The segment of inflamed mid to distal
ileum measures perhaps 16 cm in length, compatible with
exacerbation of Crohn's disease.

The more proximal small bowel is unremarkable in appearance.  The
stomach is within normal limits.  No acute vascular abnormalities
are seen.

The remaining colon is unremarkable in appearance.

The bladder is mildly distended and grossly unremarkable.  The
uterus is within normal limits; an intrauterine device is noted in
expected position at the fundus of the uterus.  The ovaries are
relatively symmetric; no suspicious adnexal masses are seen.  No
inguinal lymphadenopathy is seen.

No acute osseous abnormalities are identified.
IMPRESSION: 1.  Inflammation of the mid to distal ileum, just proximal to the
patient's ileocolic anastomosis at the right upper quadrant, with
diffuse wall thickening and associated fecalization.  The segment
of inflamed mid to distal ileum measures perhaps 16 cm in length,
compatible with exacerbation of Crohn's disease.  Underlying
fecalization reflects dysmotility.
2.  Otherwise unremarkable CT of the abdomen and pelvis.

## 2013-11-25 ENCOUNTER — Other Ambulatory Visit: Payer: Self-pay | Admitting: Physician Assistant

## 2013-11-25 ENCOUNTER — Telehealth: Payer: Self-pay | Admitting: Internal Medicine

## 2013-11-25 NOTE — Telephone Encounter (Signed)
Tramadol can cause constipation. I assume she is on Humira, Imuran. How about Prednisone? She can taper it from 10 mg to 5 mg x 2 weeks, then stop if she is still on it. She was suppoesed to see me in 3 months. ( end of September).

## 2013-11-25 NOTE — Telephone Encounter (Signed)
Left a message for patient to call back. 

## 2013-11-25 NOTE — Telephone Encounter (Signed)
Spoke with patient and she is taking Humira but is off Imuran. She is not taking any Prednisone at this time. Cramping is continuing with Tramadol and Dicyclomine. Scheduled OV on 11/29/13 at 2:00 PM with Dr. Juanda Chance.

## 2013-11-25 NOTE — Telephone Encounter (Signed)
Patient reports she had diarrhea last week and took anti diarrhea medications. She then had constipation. On Saturday, she started having abdominal cramping and on Sunday reports the cramping was worse and her abdomen was a little tender. She took Tramadol and Dicyclomine which helped some. She did not have a a bowel movement yesterday. Today, she had one stool that was hard and one stool that was loose. Denies bleeding.

## 2013-11-26 MED ORDER — TRAMADOL HCL 50 MG PO TABS: ORAL_TABLET | ORAL | Status: DC

## 2013-11-26 NOTE — Telephone Encounter (Signed)
She can increase Dicyclomine to 20 mg ( 2 tabs ) po tid. May take 2 Tramadol prn pain.

## 2013-11-26 NOTE — Telephone Encounter (Signed)
Patient notified of recommendations. New rx for Tramadol sent to pharmacy.

## 2013-11-26 NOTE — Telephone Encounter (Signed)
Patient is asking if Dr. Juanda Chance has any suggestions for the the cramping and on and off diarrhea until OV on 12/03/13.

## 2013-12-03 ENCOUNTER — Ambulatory Visit: Payer: Self-pay | Admitting: Internal Medicine

## 2013-12-27 ENCOUNTER — Ambulatory Visit: Payer: Self-pay | Admitting: Internal Medicine

## 2014-01-07 ENCOUNTER — Other Ambulatory Visit (INDEPENDENT_AMBULATORY_CARE_PROVIDER_SITE_OTHER): Payer: 59

## 2014-01-07 ENCOUNTER — Ambulatory Visit (INDEPENDENT_AMBULATORY_CARE_PROVIDER_SITE_OTHER): Payer: 59 | Admitting: Internal Medicine

## 2014-01-07 ENCOUNTER — Encounter: Payer: Self-pay | Admitting: Internal Medicine

## 2014-01-07 VITALS — BP 114/84 | HR 96 | Ht 68.0 in | Wt 238.2 lb

## 2014-01-07 DIAGNOSIS — D899 Disorder involving the immune mechanism, unspecified: Secondary | ICD-10-CM

## 2014-01-07 DIAGNOSIS — D849 Immunodeficiency, unspecified: Secondary | ICD-10-CM

## 2014-01-07 DIAGNOSIS — K5000 Crohn's disease of small intestine without complications: Secondary | ICD-10-CM

## 2014-01-07 LAB — CBC WITH DIFFERENTIAL/PLATELET
Basophils Absolute: 0.1 10*3/uL (ref 0.0–0.1)
Basophils Relative: 1 % (ref 0.0–3.0)
EOS PCT: 1.3 % (ref 0.0–5.0)
Eosinophils Absolute: 0.2 10*3/uL (ref 0.0–0.7)
HEMATOCRIT: 42.9 % (ref 36.0–46.0)
Hemoglobin: 14.1 g/dL (ref 12.0–15.0)
LYMPHS ABS: 3.1 10*3/uL (ref 0.7–4.0)
Lymphocytes Relative: 22.7 % (ref 12.0–46.0)
MCHC: 32.9 g/dL (ref 30.0–36.0)
MCV: 92.3 fl (ref 78.0–100.0)
MONO ABS: 1 10*3/uL (ref 0.1–1.0)
Monocytes Relative: 7.6 % (ref 3.0–12.0)
NEUTROS ABS: 9.2 10*3/uL — AB (ref 1.4–7.7)
Neutrophils Relative %: 67.4 % (ref 43.0–77.0)
Platelets: 398 10*3/uL (ref 150.0–400.0)
RBC: 4.65 Mil/uL (ref 3.87–5.11)
RDW: 14.1 % (ref 11.5–15.5)
WBC: 13.7 10*3/uL — AB (ref 4.0–10.5)

## 2014-01-07 LAB — COMPREHENSIVE METABOLIC PANEL
ALK PHOS: 59 U/L (ref 39–117)
ALT: 23 U/L (ref 0–35)
AST: 23 U/L (ref 0–37)
Albumin: 3.2 g/dL — ABNORMAL LOW (ref 3.5–5.2)
BILIRUBIN TOTAL: 0.6 mg/dL (ref 0.2–1.2)
BUN: 11 mg/dL (ref 6–23)
CO2: 19 mEq/L (ref 19–32)
Calcium: 9.1 mg/dL (ref 8.4–10.5)
Chloride: 107 mEq/L (ref 96–112)
Creatinine, Ser: 0.7 mg/dL (ref 0.4–1.2)
GFR: 125.06 mL/min (ref >60.00)
GLUCOSE: 104 mg/dL — AB (ref 70–99)
Potassium: 3.6 mEq/L (ref 3.5–5.1)
Sodium: 135 mEq/L (ref 135–145)
Total Protein: 6.9 g/dL (ref 6.0–8.3)

## 2014-01-07 LAB — SEDIMENTATION RATE: Sed Rate: 6 mm/hr (ref 0–22)

## 2014-01-07 MED ORDER — TRAMADOL HCL 50 MG PO TABS: ORAL_TABLET | ORAL | Status: AC

## 2014-01-07 MED ORDER — DICYCLOMINE HCL 10 MG PO CAPS: ORAL_CAPSULE | ORAL | Status: AC

## 2014-01-07 MED ORDER — ALPRAZOLAM 0.25 MG PO TABS: ORAL_TABLET | ORAL | Status: AC

## 2014-01-07 NOTE — Progress Notes (Signed)
Heather Foley Dec 23, 1980 [id number]  Note: This dictation was prepared with Dragon digital system. Any transcriptional errors that result from this procedure are unintentional.   History of Present Illness: This is a 33 year old African-American female with Crohn's disease of the terminal ileum since 2010. Terminal ileum resected at [facility] in 2010. She moved to [facility] shortly after the surgery and had to be hospitalized with flareup . Last colonoscopy in May 2011 showed narrow anastomosis,  biopsies from the anastomosis were consistent with chronic active colitis. She  had another flareup in April 2015 while on Imuran 100mg   and responded to prednisone taper. She was  started on Humira in June 2015 starting with an induction regimen and continuing  40 mg every 2 weeks. She has felt overall  much improved in the  level of energy, lack of bdominal pain and diarrhea. She has not missed any work. She needs a refill for tramadol, Xanax, Bentyl. She is up-to-date on the TB skin test. She has lost 3 pounds intentionally since last appointment June 2015. Imuran was discontinued when Humira was started. She denies any side effects from Humira other than hair thinning    Past Medical History  Diagnosis Date  . Iron deficiency anemia   . Acute pancreatitis   . Crohn disease     Past Surgical History  Procedure Laterality Date  . Umbilical hernia repair    . Small intestine surgery      with right hemicolectomy-in [place]    Allergies  Allergen Reactions  . Penicillins Other (See Comments)    childhood    Family history and social history have been reviewed.  Review of Systems:   The remainder of the 10 point ROS is negative except as outlined in the H&P  Physical Exam: General Appearance Well developed, in no distress Eyes  Non icteric  HEENT  Non traumatic, normocephalic  Mouth No lesion, tongue papillated, no cheilosis Neck Supple without adenopathy, thyroid not  enlarged, no carotid bruits, no JVD Lungs Clear to auscultation bilaterally COR Normal S1, normal S2, regular rhythm, no murmur, quiet precordium Abdomen soft, obese, nontender. Normoactive bowel sounds. No distention. Well-healed surgical scar Rectal not done Extremities  No pedal edema Skin No lesions Neurological Alert and oriented x 3 Psychological Normal mood and affect  Assessment and Plan:   33 year old African-American female with Crohn's ileitis who is post terminal ileal resection 5 years ago and who has started on Humira 4 months ago and is doing very well. She will be due for recall colonoscopy in May 2016. In the meantime she will continue on the same dose of Humira. We will check her CBC, sedimentation rate and metabolic panel today. I will see her in 6 months    [name] 01/07/2014

## 2014-01-07 NOTE — Patient Instructions (Addendum)
We have sent the following medications to your pharmacy for you to pick up at your convenience: Bentyl Tramadol Xanax  Your physician has requested that you go to the basement for the following lab work before leaving today: CBC, Sed Rate, CMET  Please follow up with Dr Juanda Chance in 6 months.  Dr  Jonny Ruiz

## 2014-01-20 ENCOUNTER — Other Ambulatory Visit: Payer: Self-pay | Admitting: Internal Medicine

## 2014-04-15 ENCOUNTER — Other Ambulatory Visit: Payer: Self-pay | Admitting: Internal Medicine

## 2014-05-23 ENCOUNTER — Telehealth: Payer: Self-pay | Admitting: *Deleted

## 2014-05-23 NOTE — Telephone Encounter (Signed)
Recall note opened.  Closing encounter.

## 2014-05-23 NOTE — Telephone Encounter (Signed)
08 pap recall due 03/2013 due to ASCUS with neg HR HPV on 04/12/12 pap  History of abnormal pap with no follow up needed per patient.  No previous records to review.  Pt was new to practice in 03/2012.  Pt has not scheduled AEX.  Please call patient to schedule AEX.  Last seen by Lovett Sox, CNM prior to [facility].  Thank you.

## 2014-05-26 ENCOUNTER — Telehealth: Payer: Self-pay | Admitting: Internal Medicine

## 2014-05-27 NOTE — Telephone Encounter (Signed)
New recall entered for 05/2014. Routing to provider for final review.  Closing encounter.  

## 2014-05-27 NOTE — Telephone Encounter (Signed)
Called and s/w patient and scheduled her AEX for 06/05/14 at 10:00 with Ms. Debbie. Had a billing question was transferred to [facility] in regards to that.

## 2014-05-28 ENCOUNTER — Telehealth: Payer: Self-pay | Admitting: *Deleted

## 2014-05-28 NOTE — Telephone Encounter (Signed)
Spoke with patient and she needs a new PA on Humira. PA submitted via CoverMyMeds.

## 2014-05-28 NOTE — Telephone Encounter (Signed)
Received fax from [facility] with PA for Humira. Patient notified.

## 2014-06-05 ENCOUNTER — Encounter: Payer: Self-pay | Admitting: Certified Nurse Midwife

## 2014-06-05 ENCOUNTER — Ambulatory Visit (INDEPENDENT_AMBULATORY_CARE_PROVIDER_SITE_OTHER): Payer: 59 | Admitting: Certified Nurse Midwife

## 2014-06-05 VITALS — BP 120/70 | HR 76 | Resp 20 | Ht 68.0 in | Wt 218.0 lb

## 2014-06-05 DIAGNOSIS — B373 Candidiasis of vulva and vagina: Secondary | ICD-10-CM

## 2014-06-05 DIAGNOSIS — Z0000 Encounter for general adult medical examination without abnormal findings: Secondary | ICD-10-CM | POA: Diagnosis not present

## 2014-06-05 DIAGNOSIS — N760 Acute vaginitis: Secondary | ICD-10-CM | POA: Diagnosis not present

## 2014-06-05 DIAGNOSIS — Z124 Encounter for screening for malignant neoplasm of cervix: Secondary | ICD-10-CM

## 2014-06-05 DIAGNOSIS — A499 Bacterial infection, unspecified: Secondary | ICD-10-CM | POA: Diagnosis not present

## 2014-06-05 DIAGNOSIS — Z23 Encounter for immunization: Secondary | ICD-10-CM

## 2014-06-05 DIAGNOSIS — B3731 Acute candidiasis of vulva and vagina: Secondary | ICD-10-CM

## 2014-06-05 DIAGNOSIS — Z01419 Encounter for gynecological examination (general) (routine) without abnormal findings: Secondary | ICD-10-CM | POA: Diagnosis not present

## 2014-06-05 DIAGNOSIS — B9689 Other specified bacterial agents as the cause of diseases classified elsewhere: Secondary | ICD-10-CM

## 2014-06-05 MED ORDER — METRONIDAZOLE 500 MG PO TABS: 500.0000 mg | ORAL_TABLET | Freq: Two times a day (BID) | ORAL | Status: AC

## 2014-06-05 MED ORDER — TERCONAZOLE 0.4 % VA CREA: 1.0000 | TOPICAL_CREAM | Freq: Every day | VAGINAL | Status: AC

## 2014-06-05 NOTE — Patient Instructions (Signed)
EXERCISE AND DIET:  We recommended that you start or continue a regular exercise program for good health. Regular exercise means any activity that makes your heart beat faster and makes you sweat.  We recommend exercising at least 30 minutes per day at least 3 days a week, preferably 4 or 5.  We also recommend a diet low in fat and sugar.  Inactivity, poor dietary choices and obesity can cause diabetes, heart attack, stroke, and kidney damage, among others.    ALCOHOL AND SMOKING:  Women should limit their alcohol intake to no more than 7 drinks/beers/glasses of wine (combined, not each!) per week. Moderation of alcohol intake to this level decreases your risk of breast cancer and liver damage. And of course, no recreational drugs are part of a healthy lifestyle.  And absolutely no smoking or even second hand smoke. Most people know smoking can cause heart and lung diseases, but did you know it also contributes to weakening of your bones? Aging of your skin?  Yellowing of your teeth and nails?  CALCIUM AND VITAMIN D:  Adequate intake of calcium and Vitamin D are recommended.  The recommendations for exact amounts of these supplements seem to change often, but generally speaking 600 mg of calcium (either carbonate or citrate) and 800 units of Vitamin D per day seems prudent. Certain women may benefit from higher intake of Vitamin D.  If you are among these women, your doctor will have told you during your visit.    PAP SMEARS:  Pap smears, to check for cervical cancer or precancers,  have traditionally been done yearly, although recent scientific advances have shown that most women can have pap smears less often.  However, every woman still should have a physical exam from her gynecologist every year. It will include a breast check, inspection of the vulva and vagina to check for abnormal growths or skin changes, a visual exam of the cervix, and then an exam to evaluate the size and shape of the uterus and  ovaries.  And after 32 years of age, a rectal exam is indicated to check for rectal cancers. We will also provide age appropriate advice regarding health maintenance, like when you should have certain vaccines, screening for sexually transmitted diseases, bone density testing, colonoscopy, mammograms, etc.   MAMMOGRAMS:  All women over 40 years old should have a yearly mammogram. Many facilities now offer a "3D" mammogram, which may cost around $50 extra out of pocket. If possible,  we recommend you accept the option to have the 3D mammogram performed.  It both reduces the number of women who will be called back for extra views which then turn out to be normal, and it is better than the routine mammogram at detecting truly abnormal areas.    COLONOSCOPY:  Colonoscopy to screen for colon cancer is recommended for all women at age 50.  We know, you hate the idea of the prep.  We agree, BUT, having colon cancer and not knowing it is worse!!  Colon cancer so often starts as a polyp that can be seen and removed at colonscopy, which can quite literally save your life!  And if your first colonoscopy is normal and you have no family history of colon cancer, most women don't have to have it again for 10 years.  Once every ten years, you can do something that may end up saving your life, right?  We will be happy to help you get it scheduled when you are ready.    Be sure to check your insurance coverage so you understand how much it will cost.  It may be covered as a preventative service at no cost, but you should check your particular policy.     Monilial Vaginitis Vaginitis in a soreness, swelling and redness (inflammation) of the vagina and vulva. Monilial vaginitis is not a sexually transmitted infection. CAUSES  Yeast vaginitis is caused by yeast (candida) that is normally found in your vagina. With a yeast infection, the candida has overgrown in number to a point that upsets the chemical balance. SYMPTOMS    White, thick vaginal discharge.  Swelling, itching, redness and irritation of the vagina and possibly the lips of the vagina (vulva).  Burning or painful urination.  Painful intercourse. DIAGNOSIS  Things that may contribute to monilial vaginitis are:  Postmenopausal and virginal states.  Pregnancy.  Infections.  Being tired, sick or stressed, especially if you had monilial vaginitis in the past.  Diabetes. Good control will help lower the chance.  Birth control pills.  Tight fitting garments.  Using bubble bath, feminine sprays, douches or deodorant tampons.  Taking certain medications that kill germs (antibiotics).  Sporadic recurrence can occur if you become ill. TREATMENT  Your caregiver will give you medication.  There are several kinds of anti monilial vaginal creams and suppositories specific for monilial vaginitis. For recurrent yeast infections, use a suppository or cream in the vagina 2 times a week, or as directed.  Anti-monilial or steroid cream for the itching or irritation of the vulva may also be used. Get your caregiver's permission.  Painting the vagina with methylene blue solution may help if the monilial cream does not work.  Eating yogurt may help prevent monilial vaginitis. HOME CARE INSTRUCTIONS   Finish all medication as prescribed.  Do not have sex until treatment is completed or after your caregiver tells you it is okay.  Take warm sitz baths.  Do not douche.  Do not use tampons, especially scented ones.  Wear cotton underwear.  Avoid tight pants and panty hose.  Tell your sexual partner that you have a yeast infection. They should go to their caregiver if they have symptoms such as mild rash or itching.  Your sexual partner should be treated as well if your infection is difficult to eliminate.  Practice safer sex. Use condoms.  Some vaginal medications cause latex condoms to fail. Vaginal medications that harm condoms  are:  Cleocin cream.  Butoconazole (Femstat).  Terconazole (Terazol) vaginal suppository.  Miconazole (Monistat) (may be purchased over the counter). SEEK MEDICAL CARE IF:   You have a temperature by mouth above 102 F (38.9 C).  The infection is getting worse after 2 days of treatment.  The infection is not getting better after 3 days of treatment.  You develop blisters in or around your vagina.  You develop vaginal bleeding, and it is not your menstrual period.  You have pain when you urinate.  You develop intestinal problems.  You have pain with sexual intercourse. Document Released: 12/08/2004 Document Revised: 05/23/2011 Document Reviewed: 08/22/2008 [facility] Patient Information 2015 [place], [place]. This information is not intended to replace advice given to you by your health care provider. Make sure you discuss any questions you have with your health care provider. Bacterial Vaginosis Bacterial vaginosis is a vaginal infection that occurs when the normal balance of bacteria in the vagina is disrupted. It results from an overgrowth of certain bacteria. This is the most common vaginal infection in women of childbearing  age. Treatment is important to prevent complications, especially in pregnant women, as it can cause a premature delivery. CAUSES  Bacterial vaginosis is caused by an increase in harmful bacteria that are normally present in smaller amounts in the vagina. Several different kinds of bacteria can cause bacterial vaginosis. However, the reason that the condition develops is not fully understood. RISK FACTORS Certain activities or behaviors can put you at an increased risk of developing bacterial vaginosis, including:  Having a new sex partner or multiple sex partners.  Douching.  Using an intrauterine device (IUD) for contraception. Women do not get bacterial vaginosis from toilet seats, bedding, swimming pools, or contact with objects around them. SIGNS  AND SYMPTOMS  Some women with bacterial vaginosis have no signs or symptoms. Common symptoms include:  Grey vaginal discharge.  A fishlike odor with discharge, especially after sexual intercourse.  Itching or burning of the vagina and vulva.  Burning or pain with urination. DIAGNOSIS  Your health care provider will take a medical history and examine the vagina for signs of bacterial vaginosis. A sample of vaginal fluid may be taken. Your health care provider will look at this sample under a microscope to check for bacteria and abnormal cells. A vaginal pH test may also be done.  TREATMENT  Bacterial vaginosis may be treated with antibiotic medicines. These may be given in the form of a pill or a vaginal cream. A second round of antibiotics may be prescribed if the condition comes back after treatment.  HOME CARE INSTRUCTIONS   Only take over-the-counter or prescription medicines as directed by your health care provider.  If antibiotic medicine was prescribed, take it as directed. Make sure you finish it even if you start to feel better.  Do not have sex until treatment is completed.  Tell all sexual partners that you have a vaginal infection. They should see their health care provider and be treated if they have problems, such as a mild rash or itching.  Practice safe sex by using condoms and only having one sex partner. SEEK MEDICAL CARE IF:   Your symptoms are not improving after 3 days of treatment.  You have increased discharge or pain.  You have a fever. MAKE SURE YOU:   Understand these instructions.  Will watch your condition.  Will get help right away if you are not doing well or get worse. FOR MORE INFORMATION  Centers for Disease Control and Prevention, Division of STD Prevention: SolutionApps.co.za American Sexual Health Association (ASHA): www.ashastd.org  Document Released: 02/28/2005 Document Revised: 12/19/2012 Document Reviewed: 10/10/2012 [facility] Patient  Information 2015 [place], [place]. This information is not intended to replace advice given to you by your health care provider. Make sure you discuss any questions you have with your health care provider.

## 2014-06-05 NOTE — Progress Notes (Signed)
34 y.o. . Single  African American Fe [id number] here for annual exam. Complaining of vaginal itching and white discharge for approximately one week. Was treated with azithromycin for viral infection and feels this may be cause.  Sees Dr Juanda Chance twice yearly for Chron's management with some labs. Patient desires STD screening and would like to have TSH checked due new history with mother having thyroid issues. No other health issues today.  Patient's last menstrual period was 05/26/2014.          Sexually active: Yes.    The current method of family planning is IUD.    Exercising: No.  exercise Smoker:  yes  Health Maintenance: Pap:  04-12-12 ASCUS HPV HR neg MMG:  none Colonoscopy:  2010 BMD:   none TDaP:  due Labs: none Self breast exam: done monthly   reports that she has been smoking.  She has never used smokeless tobacco. She reports that she drinks alcohol. She reports that she does not use illicit drugs.  Past Medical History  Diagnosis Date  . Iron deficiency anemia   . Acute pancreatitis   . Crohn disease   . Abnormal Pap smear of cervix 2009    no follow up  . Blood transfusion without reported diagnosis 2009  . 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 Umbilical hernia repair    . Small intestine surgery  2010    with right hemicolectomy-in [place] (colon resection)  . Paragard      iud insertion 08/2007 due for removal 08/2017    Current Outpatient Prescriptions  Medication Sig Dispense Refill  . acetaminophen (TYLENOL) 325 MG tablet Take 650 mg by mouth every 6 (six) hours as needed for pain.    Marland Kitchen ALPRAZolam (XANAX) 0.25 MG tablet Take 1/2 tab at bedtime as needed for sleep 30 tablet 1  . dicyclomine (BENTYL) 10 MG capsule Take 1 tab 3 times daily as needed for abdominal cramping, spasms. 30 capsule 2  . HUMIRA PEN 40 MG/0.8ML PNKT Inject subcutaneously   every  other week 2 each 2  . Prenatal Vit-Fe Fumarate-FA (PRENATAL VITAMIN PO) Take by mouth daily.    . Probiotic Product (PROBIOTIC PO) Take by mouth daily.    . traMADol (ULTRAM) 50 MG tablet Take one or two tablets po prn pain 30 tablet 0  . VYVANSE 10 MG CAPS daily.  0  . VYVANSE 60 MG capsule daily.  0   No current facility-administered medications for this visit.    Family History  Problem Relation Age of Onset  . Colon cancer Neg Hx   . Crohn's disease Cousin   . Diabetes Father   . Thyroid disease Mother   . Heart disease Maternal Grandfather   . Heart disease Paternal Grandfather     ROS:  Pertinent items are noted in HPI.  Otherwise, a comprehensive ROS was negative.  Exam:   BP 120/70 mmHg  Pulse 76  Resp 20  Ht  (1.727 m)  Wt 218 lb (98.884 kg)  BMI 33.15 kg/m2  LMP 05/26/2014 Height:  (172.7 cm) Ht Readings from Last 3 Encounters:  06/05/14  (1.727 m)  01/07/14  (1.727 m)  09/04/13  (1.727 m)    General appearance: alert, cooperative and appears stated age Head: Normocephalic, without obvious abnormality, atraumatic Neck: no adenopathy, supple, symmetrical, trachea  midline and thyroid normal to inspection and palpation Lungs: clear to auscultation bilaterally Breasts: normal appearance, no masses or tenderness, No nipple retraction or dimpling, No nipple discharge or bleeding, No axillary or supraclavicular adenopathy Heart: regular rate and rhythm Abdomen: soft, non-tender; no masses,  no organomegaly Extremities: extremities normal, atraumatic, no cyanosis or edema Skin: Skin color, texture, turgor normal. No rashes or lesions Lymph nodes: Cervical, supraclavicular, and axillary nodes normal. No abnormal inguinal nodes palpated Neurologic: Grossly normal   Pelvic: External genitalia:  no lesions              Urethra:  normal appearing urethra with no masses, tenderness or lesions              Bartholin's and Skene's: normal                  Vagina: normal appearing vagina with normal color and  white watery odorous discharge no lesions ph 5.0 wet prep taken              Cervix: normal, non tender, IUD string noted in cervix              Pap taken: Yes.   Bimanual Exam:  Uterus:  normal size, contour, position, consistency, mobility, non-tender              Adnexa: normal adnexa and no mass, fullness, tenderness               Rectovaginal: Confirms               Anus:  normal sphincter tone, no lesions  Chaperone present: Yes  A:  Well Woman with normal exam  Contraception Paragard IUD due for removal 2019  Yeast vaginitis  BV  Immunization due  STD screening  History of ASCUS - HPVHR follow pap today  Chron's disease with Dr. Juanda Chance management  P:   Reviewed health and wellness pertinent to exam  Aware of warning with IUD  Discussed finding of BV and yeast and etiology  Treatment indicated. Discussed aveeno sitz bath prn comfort.  Rx Terazol 7 see order  Rx Flagyl see order  Requests TDAP  Lab GC, Chlamydia, HIV, RPR, TSH, Vitamin D  Pap smear taken today with HPVHR if negative repeat one year if not per results   counseled on breast self exam, STD prevention, HIV risk factors and prevention, adequate intake of calcium and vitamin D, diet and exercise  return annually or prn  An After Visit Summary was printed and given to the patient.

## 2014-06-06 LAB — HEPATITIS C ANTIBODY: HCV Ab: NEGATIVE

## 2014-06-06 LAB — VITAMIN D 25 HYDROXY (VIT D DEFICIENCY, FRACTURES): VIT D 25 HYDROXY: 19 ng/mL — AB (ref 30–100)

## 2014-06-06 LAB — TSH: TSH: 0.511 u[IU]/mL (ref 0.350–4.500)

## 2014-06-06 LAB — HIV ANTIBODY (ROUTINE TESTING W REFLEX): HIV: NONREACTIVE

## 2014-06-06 LAB — RPR

## 2014-06-07 LAB — IPS N GONORRHOEA AND CHLAMYDIA BY PCR

## 2014-06-09 LAB — IPS PAP TEST WITH HPV

## 2014-06-10 ENCOUNTER — Telehealth: Payer: Self-pay

## 2014-06-10 NOTE — Telephone Encounter (Signed)
-----   Message from Verner Chol, CNM sent at 06/08/2014  5:53 PM EDT ----- Notify HIV, RPR, Hep. C, GC, Chlamydia are negative Vitamin D very low protocol will cause fatigue if low TSH is normal

## 2014-06-10 NOTE — Telephone Encounter (Signed)
lmtcb

## 2014-06-10 NOTE — Telephone Encounter (Signed)
Spoke with patient. Advised patient of results as seen below from Verner Chol CNM. Patient is agreeable. Patient will start Vitamin D protocol at this time. Follow up lab appointment scheduled for 09/11/2014 at 8:30am. Patient is agreeable to date and time.  Routing to provider for final review. Patient agreeable to disposition. Will close encounter

## 2014-06-10 NOTE — Telephone Encounter (Signed)
Returning a call to Joy. °

## 2014-06-11 NOTE — Progress Notes (Signed)
Reviewed personally.  M. Suzanne Sevin Farone, MD.  

## 2014-06-16 ENCOUNTER — Telehealth: Payer: Self-pay | Admitting: *Deleted

## 2014-06-16 DIAGNOSIS — K50119 Crohn's disease of large intestine with unspecified complications: Secondary | ICD-10-CM

## 2014-06-16 NOTE — Telephone Encounter (Signed)
Left a message for patient to call back. Lab in EPIC. 

## 2014-06-17 NOTE — Telephone Encounter (Signed)
Left a message for patient to call back. 

## 2014-06-18 NOTE — Telephone Encounter (Signed)
Spoke with patient and told her about her TB testing due in April. She states has been off her Humira for 2 weeks. She has capped out on the program she was enrolled in. She has applied for assistance for Humira and it may be 3 weeks before she hears back about it. States she is not having symptoms but can tell she is due for a shot.

## 2014-06-18 NOTE — Telephone Encounter (Signed)
Do we have any Humira samples? Heather Foley sometimes has some. She still needs the TB test because she needs to get backon Humira. May be the Drug rep can help

## 2014-06-18 NOTE — Telephone Encounter (Signed)
Left a message for patient to call back. 

## 2014-06-20 ENCOUNTER — Encounter: Payer: Self-pay | Admitting: Internal Medicine

## 2014-06-23 ENCOUNTER — Other Ambulatory Visit: Payer: Self-pay | Admitting: *Deleted

## 2014-06-23 MED ORDER — ADALIMUMAB 40 MG/0.8ML ~~LOC~~ AJKT: 40.0000 mg | AUTO-INJECTOR | SUBCUTANEOUS | Status: AC

## 2014-06-23 NOTE — Telephone Encounter (Signed)
Spoke with patient and she will come for samples today.

## 2014-06-24 NOTE — Telephone Encounter (Signed)
Patient picked up samples and left form for MD to complete for Humira assistance.

## 2014-06-26 ENCOUNTER — Telehealth: Payer: Self-pay | Admitting: *Deleted

## 2014-06-26 NOTE — Telephone Encounter (Signed)
Left a message for patient to call. We received a letter from [place] that patient has been approved for Humira assistance.

## 2014-06-26 NOTE — Telephone Encounter (Signed)
Patient notified of approval for Humira assistance.

## 2014-07-14 NOTE — Telephone Encounter (Signed)
I have left a message for patient to call back. She still has not had her TB test. She is due for this and will be unable to continue getting Humira without appropriate screening tests.

## 2014-07-23 ENCOUNTER — Encounter: Payer: Self-pay | Admitting: *Deleted

## 2014-07-31 ENCOUNTER — Telehealth: Payer: Self-pay | Admitting: *Deleted

## 2014-07-31 NOTE — Telephone Encounter (Signed)
-----   Message from Daphine Deutscher, RN sent at 07/23/2014  1:41 PM EDT ----- Did patient have TB testing? Letter mailed after multiple calls. DB

## 2014-07-31 NOTE — Telephone Encounter (Signed)
Left a message for patient to call back. 

## 2014-08-04 ENCOUNTER — Other Ambulatory Visit: Payer: Self-pay

## 2014-08-04 DIAGNOSIS — K50119 Crohn's disease of large intestine with unspecified complications: Secondary | ICD-10-CM

## 2014-08-04 NOTE — Telephone Encounter (Signed)
Spoke with patient and she states she will come today for lab.

## 2014-08-06 LAB — QUANTIFERON TB GOLD ASSAY (BLOOD)
Interferon Gamma Release Assay: NEGATIVE
Quantiferon Nil Value: 0.03 IU/mL
Quantiferon Tb Ag Minus Nil Value: 0 IU/mL
TB Ag value: 0.03 IU/mL

## 2014-09-11 ENCOUNTER — Other Ambulatory Visit: Payer: 59

## 2014-09-25 ENCOUNTER — Other Ambulatory Visit: Payer: 59

## 2014-10-02 ENCOUNTER — Other Ambulatory Visit (INDEPENDENT_AMBULATORY_CARE_PROVIDER_SITE_OTHER): Payer: 59

## 2014-10-02 DIAGNOSIS — Z01419 Encounter for gynecological examination (general) (routine) without abnormal findings: Secondary | ICD-10-CM

## 2014-10-02 DIAGNOSIS — E559 Vitamin D deficiency, unspecified: Secondary | ICD-10-CM

## 2014-10-03 ENCOUNTER — Telehealth: Payer: Self-pay

## 2014-10-03 LAB — VITAMIN D 25 HYDROXY (VIT D DEFICIENCY, FRACTURES): VIT D 25 HYDROXY: 16 ng/mL — AB (ref 30–100)

## 2014-10-03 NOTE — Telephone Encounter (Signed)
-----   Message from Verner Chol, CNM sent at 10/03/2014  8:02 AM EDT ----- Notify patient that Vitamin D is still very low at 16 previous 19 protocol Work on Vitamin D foods

## 2014-10-03 NOTE — Telephone Encounter (Signed)
lmtcb

## 2014-10-07 ENCOUNTER — Other Ambulatory Visit: Payer: Self-pay

## 2014-10-07 DIAGNOSIS — E559 Vitamin D deficiency, unspecified: Secondary | ICD-10-CM

## 2014-10-07 MED ORDER — VITAMIN D (ERGOCALCIFEROL) 1.25 MG (50000 UNIT) PO CAPS
50000.0000 [IU] | ORAL_CAPSULE | ORAL | Status: AC
Start: 2014-10-07 — End: ?

## 2014-10-07 NOTE — Telephone Encounter (Signed)
Patient notified of results. See lab 

## 2014-10-07 NOTE — Telephone Encounter (Signed)
rx sent to pharmacy for vit d . Please see lab

## 2014-11-20 ENCOUNTER — Telehealth: Payer: Self-pay | Admitting: *Deleted

## 2014-11-20 NOTE — Telephone Encounter (Signed)
Left a message for patient to call back. (Needs new GI MD) 

## 2014-11-25 NOTE — Telephone Encounter (Signed)
Spoke with patient and she would like to switch to Dr. Lavon Paganini.  She has a new job and does not have insurance for 90 days. She will call next month to schedule an OV.

## 2014-11-25 NOTE — Telephone Encounter (Signed)
Left a message for patient to call back. 

## 2014-11-26 ENCOUNTER — Ambulatory Visit (INDEPENDENT_AMBULATORY_CARE_PROVIDER_SITE_OTHER): Payer: Commercial Managed Care - HMO | Admitting: Physician Assistant

## 2014-11-26 VITALS — BP 138/84 | HR 86 | Temp 97.8°F | Resp 18 | Ht 68.5 in | Wt 220.0 lb

## 2014-11-26 DIAGNOSIS — Z111 Encounter for screening for respiratory tuberculosis: Secondary | ICD-10-CM

## 2014-11-26 DIAGNOSIS — Z1159 Encounter for screening for other viral diseases: Secondary | ICD-10-CM

## 2014-11-26 NOTE — Progress Notes (Signed)
   Subjective:    Patient ID: Heather Foley, female    DOB: 1980-07-21, 34 y.o.   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 No recent illness, fever, cough, or SOB. No recent travel. Has never had positive TB test. Med allergy: PCN.    Review of Systems  Constitutional: Negative for fever, chills and fatigue.  Respiratory: Negative for cough and shortness of breath.   Cardiovascular: Negative for chest pain.       Objective:   Physical Exam  Constitutional: She is oriented to person, place, and time. She appears well-developed and well-nourished. No distress.  Blood pressure 138/84, pulse 86, temperature 97.8 F (36.6 C), temperature source Oral, resp. rate 18, height 5' 8.5" (1.74 m), weight 220 lb (99.791 kg), last menstrual period 11/17/2014, SpO2 99 %.  HENT:  Head: Normocephalic and atraumatic.  Right Ear: External ear normal.  Left Ear: External ear normal.  Eyes: Conjunctivae are normal. Right eye exhibits no discharge. Left eye exhibits no discharge. No scleral icterus.  Pulmonary/Chest: Effort normal.  Neurological: She is alert and oriented to person, place, and time.  Skin: She is not diaphoretic.  Psychiatric: She has a normal mood and affect. Her behavior is normal. Judgment and thought content normal.       Assessment & Plan:  1. Need for hepatitis B screening test - Hepatitis B surface antibody - Hepatitis B surface antigen  2. Screening for tuberculosis RTC in 48 to 72 hrs for reading.  - TB Skin Test   Janan Ridge PA-C  [facility] 11/26/2014 1:06 PM

## 2014-11-26 NOTE — Progress Notes (Signed)
  Tuberculosis Risk Questionnaire  1. No Were you born outside the [place] in one of the following parts of the world: [place], [place], [place], [place] or [place]?    2. No Have you traveled outside the [place] and lived for more than one month in one of the following parts of the world: [place], [place], [place], [place] or [place]?    3. Yes  Do you have a compromised immune system such as from any of the following conditions:HIV/AIDS, organ or bone marrow transplantation, diabetes, immunosuppressive medicines (e.g. Prednisone, Remicaide), leukemia, lymphoma, cancer of the head or neck, gastrectomy or jejunal bypass, end-stage renal disease (on dialysis), or silicosis?     4. No Have you ever or do you plan on working in: a residential care center, a health care facility, a jail or prison or homeless shelter?    5. No Have you ever: injected illegal drugs, used crack cocaine, lived in a homeless shelter  or been in jail or prison?     6. No Have you ever been exposed to anyone with infectious tuberculosis?    Tuberculosis Symptom Questionnaire  Do you currently have any of the following symptoms?  1. No Unexplained cough lasting more than 3 weeks?   2. No Unexplained fever lasting more than 3 weeks.   3. No Night Sweats (sweating that leaves the bedclothes and sheets wet)     4. No Shortness of Breath   5. No Chest Pain   6. No Unintentional weight loss    7. No Unexplained fatigue (very tired for no reason)    

## 2014-11-27 LAB — HEPATITIS B SURFACE ANTIGEN: HEP B S AG: NEGATIVE

## 2014-11-27 LAB — HEPATITIS B SURFACE ANTIBODY, QUANTITATIVE: Hepatitis B-Post: 6.6 m[IU]/mL

## 2014-11-28 ENCOUNTER — Ambulatory Visit (INDEPENDENT_AMBULATORY_CARE_PROVIDER_SITE_OTHER): Payer: Commercial Managed Care - HMO | Admitting: *Deleted

## 2014-11-28 DIAGNOSIS — Z111 Encounter for screening for respiratory tuberculosis: Secondary | ICD-10-CM

## 2014-11-28 DIAGNOSIS — Z7689 Persons encountering health services in other specified circumstances: Secondary | ICD-10-CM

## 2014-11-28 LAB — TB SKIN TEST
Induration: 0 mm
TB SKIN TEST: NEGATIVE

## 2014-11-28 NOTE — Progress Notes (Signed)
   Subjective:    Patient ID: Heather Foley, female    DOB: 15-Jan-1981, 34 y.o.   MRN: [id number]  HPI Patient here for ppd read. Negative reading with 0mm induration.    Review of Systems     Objective:   Physical Exam        Assessment & Plan:

## 2014-11-29 ENCOUNTER — Telehealth: Payer: Self-pay | Admitting: Physician Assistant

## 2014-11-29 ENCOUNTER — Encounter: Payer: Self-pay | Admitting: Physician Assistant

## 2014-11-29 NOTE — Telephone Encounter (Signed)
Patient not hep b immune. So may still need vaccination for work. Will send results in mail.

## 2014-11-29 NOTE — Telephone Encounter (Signed)
error 

## 2015-01-03 NOTE — Progress Notes (Signed)
  Medical screening examination/treatment/procedure(s) were performed by non-physician practitioner and as supervising physician I was immediately available for consultation/collaboration.     

## 2015-01-05 ENCOUNTER — Telehealth: Payer: Self-pay | Admitting: Certified Nurse Midwife

## 2015-01-05 NOTE — Telephone Encounter (Signed)
Patient canceled her lab appointment for vit d reck check. She states she is without health insurance and will call back to reschedule at a later time.

## 2015-01-06 NOTE — Telephone Encounter (Signed)
Can you follow this 

## 2015-01-06 NOTE — Telephone Encounter (Signed)
I will call patient at a later date

## 2015-01-08 ENCOUNTER — Other Ambulatory Visit: Payer: 59

## 2015-01-22 NOTE — Telephone Encounter (Signed)
Pt states she will have it done once she gets insurance.

## 2015-01-22 NOTE — Telephone Encounter (Signed)
Left message to call back  

## 2015-01-22 NOTE — Telephone Encounter (Signed)
She need to continue OTC Vitamin D 3 to maintain on daily basis, if unable to come in.

## 2015-01-27 NOTE — Telephone Encounter (Signed)
Pt.notified

## 2015-06-17 ENCOUNTER — Ambulatory Visit: Payer: 59 | Admitting: Certified Nurse Midwife

## 2015-06-17 ENCOUNTER — Encounter: Payer: Self-pay | Admitting: Certified Nurse Midwife

## 2022-03-15 ENCOUNTER — Emergency Department (HOSPITAL_COMMUNITY)
Admission: EM | Admit: 2022-03-15 | Discharge: 2022-03-15 | Discharge status: Against Medical Advice | Payer: Medicaid Other | Attending: Emergency Medicine | Admitting: Emergency Medicine

## 2022-03-15 ENCOUNTER — Emergency Department (HOSPITAL_COMMUNITY): Payer: Medicaid Other

## 2022-03-15 DIAGNOSIS — R109 Unspecified abdominal pain: Secondary | ICD-10-CM | POA: Diagnosis present

## 2022-03-15 DIAGNOSIS — R112 Nausea with vomiting, unspecified: Secondary | ICD-10-CM | POA: Insufficient documentation

## 2022-03-15 DIAGNOSIS — Z5321 Procedure and treatment not carried out due to patient leaving prior to being seen by health care provider: Secondary | ICD-10-CM | POA: Diagnosis not present

## 2022-03-15 LAB — URINALYSIS, ROUTINE W REFLEX MICROSCOPIC
Bacteria, UA: NONE SEEN
Bilirubin Urine: NEGATIVE
Glucose, UA: NEGATIVE mg/dL
Hgb urine dipstick: NEGATIVE
Ketones, ur: 5 mg/dL — AB
Leukocytes,Ua: NEGATIVE
Nitrite: NEGATIVE
Protein, ur: 30 mg/dL — AB
Specific Gravity, Urine: 1.015 (ref 1.005–1.030)
pH: 7 (ref 5.0–8.0)

## 2022-03-15 LAB — CBC
HCT: 45.8 % (ref 36.0–46.0)
Hemoglobin: 16 g/dL — ABNORMAL HIGH (ref 12.0–15.0)
MCH: 31.9 pg (ref 26.0–34.0)
MCHC: 34.9 g/dL (ref 30.0–36.0)
MCV: 91.4 fL (ref 80.0–100.0)
Platelets: 387 10*3/uL (ref 150–400)
RBC: 5.01 MIL/uL (ref 3.87–5.11)
RDW: 12.9 % (ref 11.5–15.5)
WBC: 15 10*3/uL — ABNORMAL HIGH (ref 4.0–10.5)
nRBC: 0 % (ref 0.0–0.2)

## 2022-03-15 LAB — COMPREHENSIVE METABOLIC PANEL
ALT: 27 U/L (ref 0–44)
AST: 24 U/L (ref 15–41)
Albumin: 3.8 g/dL (ref 3.5–5.0)
Alkaline Phosphatase: 95 U/L (ref 38–126)
Anion gap: 13 (ref 5–15)
BUN: 8 mg/dL (ref 6–20)
CO2: 21 mmol/L — ABNORMAL LOW (ref 22–32)
Calcium: 9.2 mg/dL (ref 8.9–10.3)
Chloride: 102 mmol/L (ref 98–111)
Creatinine, Ser: 0.68 mg/dL (ref 0.44–1.00)
GFR, Estimated: >60 mL/min (ref >60)
Glucose, Bld: 127 mg/dL — ABNORMAL HIGH (ref 70–99)
Potassium: 3.4 mmol/L — ABNORMAL LOW (ref 3.5–5.1)
Sodium: 136 mmol/L (ref 135–145)
Total Bilirubin: 0.7 mg/dL (ref 0.3–1.2)
Total Protein: 7.1 g/dL (ref 6.5–8.1)

## 2022-03-15 LAB — I-STAT BETA HCG BLOOD, ED (MC, WL, AP ONLY): I-stat hCG, quantitative: <5 m[IU]/mL (ref <5)

## 2022-03-15 LAB — LIPASE, BLOOD: Lipase: 27 U/L (ref 11–51)

## 2022-03-15 MED ORDER — ONDANSETRON 4 MG PO TBDP: 4.0000 mg | ORAL_TABLET | Freq: Once | ORAL | Status: DC

## 2022-03-15 MED ORDER — IOHEXOL 350 MG/ML SOLN
75.0000 mL | Freq: Once | INTRAVENOUS | Status: AC | PRN
  Administered 2022-03-15: 75 mL via INTRAVENOUS

## 2022-03-15 NOTE — ED Triage Notes (Signed)
Patient with history of Crohn's disease here with compliant of abdominal cramping that started yesterday afternoon. Patient reports vomiting, denies diarrhea. Patient is alert, oriented, and in no apparent distress at this time.

## 2022-03-15 NOTE — ED Notes (Signed)
Pt called for room but no answer at this time.

## 2022-03-15 NOTE — ED Provider Triage Note (Signed)
Emergency Medicine Provider Triage Evaluation Note  Heather Foley , a 42 y.o. female  was evaluated in triage.  Pt complains of crampy right-sided abdominal pain since last night with nausea and vomiting.  Concern for Crohn's flare.  No fever.  States Stelara.  No diarrhea..  Review of Systems  Positive: Abdominal pain, vomiting Negative: Fever, chest pain, shortness of breath  Physical Exam  BP (!) 147/101 (BP Location: Right Arm)   Pulse 76   Temp 98.5 F (36.9 C) (Oral)   Resp 16   SpO2 100%  Gen:   Awake, no distress uncomfortable Resp:  Normal effort  MSK:   Moves extremities without difficulty  Other:  Right-sided abdominal tenderness, no guarding or rebound  Medical Decision Making  Medically screening exam initiated at 8:11 AM.  Appropriate orders placed.  Makaiyah Schweiger was informed that the remainder of the evaluation will be completed by another provider, this initial triage assessment does not replace that evaluation, and the importance of remaining in the ED until their evaluation is complete.     Ezequiel Essex, MD 03/15/22 [phone]
